# Patient Record
Sex: Female | Born: 1990 | Hispanic: Yes | Marital: Single
Health system: Midwestern US, Community
[De-identification: ages and names within clinical notes are randomized; demographics above are authoritative.]

## PROBLEM LIST (undated history)

## (undated) DIAGNOSIS — G5751 Tarsal tunnel syndrome, right lower limb: Secondary | ICD-10-CM

## (undated) DIAGNOSIS — M79673 Pain in unspecified foot: Secondary | ICD-10-CM

## (undated) DIAGNOSIS — M25571 Pain in right ankle and joints of right foot: Secondary | ICD-10-CM

## (undated) DIAGNOSIS — Z01818 Encounter for other preprocedural examination: Secondary | ICD-10-CM

## (undated) DIAGNOSIS — F909 Attention-deficit hyperactivity disorder, unspecified type: Secondary | ICD-10-CM

## (undated) DIAGNOSIS — N83209 Unspecified ovarian cyst, unspecified side: Secondary | ICD-10-CM

## (undated) DIAGNOSIS — G8929 Other chronic pain: Secondary | ICD-10-CM

## (undated) DIAGNOSIS — D3500 Benign neoplasm of unspecified adrenal gland: Secondary | ICD-10-CM

## (undated) DIAGNOSIS — F329 Major depressive disorder, single episode, unspecified: Secondary | ICD-10-CM

## (undated) DIAGNOSIS — J40 Bronchitis, not specified as acute or chronic: Secondary | ICD-10-CM

## (undated) DIAGNOSIS — R102 Pelvic and perineal pain: Secondary | ICD-10-CM

## (undated) DIAGNOSIS — M549 Dorsalgia, unspecified: Secondary | ICD-10-CM

## (undated) DIAGNOSIS — F419 Anxiety disorder, unspecified: Secondary | ICD-10-CM

## (undated) DIAGNOSIS — F32A Depression, unspecified: Secondary | ICD-10-CM

## (undated) DIAGNOSIS — K529 Noninfective gastroenteritis and colitis, unspecified: Secondary | ICD-10-CM

## (undated) HISTORY — PX: FOOT SURGERY: SHX648

## (undated) HISTORY — PX: OOPHORECTOMY: SHX86

## (undated) HISTORY — DX: Benign neoplasm of unspecified adrenal gland: D35.00

## (undated) HISTORY — PX: ADRENALECTOMY: SHX876

---
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
## 2009-01-14 ENCOUNTER — Emergency Department (HOSPITAL_COMMUNITY): Admission: EM | Admit: 2009-01-14 | Discharge: 2009-01-14 | Payer: Self-pay | Admitting: Emergency Medicine

## 2009-02-19 ENCOUNTER — Encounter: Payer: Self-pay | Admitting: Orthopaedic Surgery

## 2009-02-20 ENCOUNTER — Encounter: Payer: Self-pay | Admitting: Orthopaedic Surgery

## 2009-03-07 ENCOUNTER — Emergency Department (HOSPITAL_COMMUNITY): Admission: EM | Admit: 2009-03-07 | Discharge: 2009-03-07 | Payer: Self-pay | Admitting: Emergency Medicine

## 2009-03-24 ENCOUNTER — Emergency Department (HOSPITAL_COMMUNITY): Admission: EM | Admit: 2009-03-24 | Discharge: 2009-03-25 | Payer: Self-pay | Admitting: Emergency Medicine

## 2009-05-05 ENCOUNTER — Emergency Department (HOSPITAL_COMMUNITY): Admission: EM | Admit: 2009-05-05 | Discharge: 2009-05-06 | Payer: Self-pay | Admitting: Emergency Medicine

## 2009-05-10 ENCOUNTER — Emergency Department (HOSPITAL_COMMUNITY): Admission: EM | Admit: 2009-05-10 | Discharge: 2009-05-10 | Payer: Self-pay | Admitting: Emergency Medicine

## 2009-05-13 ENCOUNTER — Ambulatory Visit (HOSPITAL_COMMUNITY): Admission: RE | Admit: 2009-05-13 | Discharge: 2009-05-13 | Payer: Self-pay | Admitting: Family Medicine

## 2010-02-25 ENCOUNTER — Emergency Department (HOSPITAL_COMMUNITY): Admission: EM | Admit: 2010-02-25 | Discharge: 2010-02-25 | Payer: Self-pay | Admitting: Emergency Medicine

## 2010-03-20 ENCOUNTER — Emergency Department (HOSPITAL_COMMUNITY): Admission: EM | Admit: 2010-03-20 | Discharge: 2010-03-20 | Payer: Self-pay | Admitting: Emergency Medicine

## 2010-05-25 ENCOUNTER — Emergency Department (HOSPITAL_COMMUNITY)
Admission: EM | Admit: 2010-05-25 | Discharge: 2010-05-25 | Payer: Self-pay | Source: Home / Self Care | Admitting: Emergency Medicine

## 2010-07-02 ENCOUNTER — Emergency Department (HOSPITAL_COMMUNITY): Admission: EM | Admit: 2010-07-02 | Discharge: 2010-07-02 | Payer: Self-pay | Admitting: Emergency Medicine

## 2010-10-22 ENCOUNTER — Emergency Department (HOSPITAL_COMMUNITY)
Admission: EM | Admit: 2010-10-22 | Discharge: 2010-10-23 | Disposition: A | Discharge status: Home / Self Care | Payer: Self-pay | Attending: Emergency Medicine | Admitting: Emergency Medicine

## 2010-10-22 ENCOUNTER — Emergency Department (HOSPITAL_COMMUNITY): Payer: Self-pay

## 2010-10-22 DIAGNOSIS — L089 Local infection of the skin and subcutaneous tissue, unspecified: Secondary | ICD-10-CM | POA: Insufficient documentation

## 2010-10-22 DIAGNOSIS — R112 Nausea with vomiting, unspecified: Secondary | ICD-10-CM | POA: Insufficient documentation

## 2010-10-22 DIAGNOSIS — R109 Unspecified abdominal pain: Secondary | ICD-10-CM | POA: Insufficient documentation

## 2010-10-23 LAB — URINE MICROSCOPIC-ADD ON

## 2010-10-23 LAB — URINALYSIS, ROUTINE W REFLEX MICROSCOPIC
Hgb urine dipstick: NEGATIVE
Ketones, ur: >80 mg/dL — AB
Protein, ur: NEGATIVE mg/dL
Specific Gravity, Urine: >1.03 — ABNORMAL HIGH (ref 1.005–1.030)
Urine Glucose, Fasting: NEGATIVE mg/dL
Urobilinogen, UA: 0.2 mg/dL (ref 0.0–1.0)
pH: 5.5 (ref 5.0–8.0)

## 2010-10-23 LAB — PREGNANCY, URINE: Preg Test, Ur: NEGATIVE

## 2010-11-27 LAB — RAPID STREP SCREEN (MED CTR MEBANE ONLY): Streptococcus, Group A Screen (Direct): NEGATIVE

## 2010-12-07 LAB — BASIC METABOLIC PANEL
BUN: 15 mg/dL (ref 6–23)
CO2: 27 mEq/L (ref 19–32)
Calcium: 9 mg/dL (ref 8.4–10.5)
Chloride: 101 mEq/L (ref 96–112)
Creatinine, Ser: 0.83 mg/dL (ref 0.4–1.2)
GFR calc Af Amer: >60 mL/min (ref >60)
GFR calc non Af Amer: >60 mL/min (ref >60)
Glucose, Bld: 106 mg/dL — ABNORMAL HIGH (ref 70–99)
Potassium: 4 mEq/L (ref 3.5–5.1)
Sodium: 137 mEq/L (ref 135–145)

## 2010-12-07 LAB — URINALYSIS, ROUTINE W REFLEX MICROSCOPIC
Bilirubin Urine: NEGATIVE
Glucose, UA: NEGATIVE mg/dL
Hgb urine dipstick: NEGATIVE
Ketones, ur: NEGATIVE mg/dL
Nitrite: NEGATIVE
Protein, ur: NEGATIVE mg/dL
Specific Gravity, Urine: 1.02 (ref 1.005–1.030)
Urobilinogen, UA: 0.2 mg/dL (ref 0.0–1.0)
pH: 5.5 (ref 5.0–8.0)

## 2010-12-07 LAB — PREGNANCY, URINE: Preg Test, Ur: NEGATIVE

## 2011-05-25 LAB — DIFFERENTIAL
Basophils Absolute: 0.2 — ABNORMAL HIGH
Basophils Relative: 1
Eosinophils Absolute: 0.2
Eosinophils Relative: 1
Lymphocytes Relative: 8 — ABNORMAL LOW
Lymphs Abs: 1.7
Monocytes Absolute: 0.9
Monocytes Relative: 4
Neutro Abs: 18.4 — ABNORMAL HIGH
Neutrophils Relative %: 86 — ABNORMAL HIGH

## 2011-05-25 LAB — WET PREP, GENITAL
Clue Cells Wet Prep HPF POC: NONE SEEN
Trich, Wet Prep: NONE SEEN
Yeast Wet Prep HPF POC: NONE SEEN

## 2011-05-25 LAB — CBC
HCT: 42.2
Hemoglobin: 14.2
MCHC: 33.6
MCV: 94.3
Platelets: 266
RBC: 4.47
RDW: 14
WBC: 21.3 — ABNORMAL HIGH

## 2011-05-25 LAB — COMPREHENSIVE METABOLIC PANEL
Alkaline Phosphatase: 50
BUN: 13
CO2: 27
Chloride: 111
Creatinine, Ser: 0.69
GFR calc non Af Amer: >60
Glucose, Bld: 126 — ABNORMAL HIGH
Total Bilirubin: 0.5

## 2011-05-25 LAB — URINALYSIS, ROUTINE W REFLEX MICROSCOPIC
Bilirubin Urine: NEGATIVE
Glucose, UA: NEGATIVE
Ketones, ur: NEGATIVE
Leukocytes, UA: NEGATIVE
Nitrite: NEGATIVE
Protein, ur: NEGATIVE
Specific Gravity, Urine: 1.025
Urobilinogen, UA: 0.2
pH: 6

## 2011-05-25 LAB — COMPREHENSIVE METABOLIC PANEL WITH GFR
ALT: 20
AST: 15
Albumin: 3 — ABNORMAL LOW
Calcium: 8.8
GFR calc Af Amer: >60
Potassium: 4.1
Sodium: 138
Total Protein: 5.6 — ABNORMAL LOW

## 2011-05-25 LAB — PREGNANCY, URINE
Preg Test, Ur: NEGATIVE
Preg Test, Ur: NEGATIVE

## 2011-05-25 LAB — LIPASE, BLOOD: Lipase: 20

## 2011-05-25 LAB — GC/CHLAMYDIA PROBE AMP, GENITAL
Chlamydia, DNA Probe: NEGATIVE
GC Probe Amp, Genital: NEGATIVE

## 2011-05-25 LAB — URINE MICROSCOPIC-ADD ON

## 2011-05-25 LAB — D-DIMER, QUANTITATIVE: D-Dimer, Quant: 0.52 — ABNORMAL HIGH

## 2011-05-31 LAB — HEPATIC FUNCTION PANEL
AST: 18
Albumin: 3.6
Alkaline Phosphatase: 60
Bilirubin, Direct: 0.1
Total Bilirubin: 0.8

## 2011-05-31 LAB — BASIC METABOLIC PANEL
CO2: 25
Calcium: 9.3
Chloride: 104
Glucose, Bld: 106 — ABNORMAL HIGH
Sodium: 138

## 2011-05-31 LAB — DIFFERENTIAL
Basophils Absolute: 0.1
Lymphocytes Relative: 7 — ABNORMAL LOW
Lymphs Abs: 1.2
Neutrophils Relative %: 88 — ABNORMAL HIGH

## 2011-05-31 LAB — PREGNANCY, URINE: Preg Test, Ur: NEGATIVE

## 2011-05-31 LAB — URINALYSIS, ROUTINE W REFLEX MICROSCOPIC
Ketones, ur: NEGATIVE
Leukocytes, UA: NEGATIVE
Nitrite: NEGATIVE
Specific Gravity, Urine: >1.03 — ABNORMAL HIGH
Urobilinogen, UA: 0.2
pH: 5

## 2011-05-31 LAB — CBC
Platelets: 281
RDW: 13.2
WBC: 19.1 — ABNORMAL HIGH

## 2011-05-31 LAB — URINE MICROSCOPIC-ADD ON

## 2011-06-07 LAB — CBC
HCT: 40.6
Platelets: 225
RDW: 13.2
WBC: 8

## 2011-06-07 LAB — RAPID URINE DRUG SCREEN, HOSP PERFORMED
Amphetamines: NOT DETECTED
Barbiturates: NOT DETECTED
Benzodiazepines: NOT DETECTED
Tetrahydrocannabinol: NOT DETECTED

## 2011-06-07 LAB — DIFFERENTIAL
Basophils Absolute: 0
Eosinophils Absolute: 0.2
Eosinophils Relative: 3
Lymphocytes Relative: 23 — ABNORMAL LOW
Neutrophils Relative %: 64

## 2011-06-07 LAB — URINALYSIS, ROUTINE W REFLEX MICROSCOPIC
Glucose, UA: NEGATIVE
Ketones, ur: NEGATIVE
Nitrite: NEGATIVE
Protein, ur: NEGATIVE
Urobilinogen, UA: 1

## 2011-06-07 LAB — BASIC METABOLIC PANEL
BUN: 6
Creatinine, Ser: 0.59
Glucose, Bld: 96
Potassium: 4.3

## 2011-06-07 LAB — PREGNANCY, URINE: Preg Test, Ur: NEGATIVE

## 2011-06-07 LAB — ETHANOL: Alcohol, Ethyl (B): <5

## 2013-10-15 ENCOUNTER — Encounter (HOSPITAL_COMMUNITY): Payer: Self-pay | Admitting: Emergency Medicine

## 2013-10-15 ENCOUNTER — Emergency Department (HOSPITAL_COMMUNITY)
Admission: EM | Admit: 2013-10-15 | Discharge: 2013-10-16 | Disposition: A | Discharge status: Home / Self Care | Payer: Medicaid - Out of State | Attending: Emergency Medicine | Admitting: Emergency Medicine

## 2013-10-15 DIAGNOSIS — N898 Other specified noninflammatory disorders of vagina: Secondary | ICD-10-CM | POA: Insufficient documentation

## 2013-10-15 DIAGNOSIS — Z3202 Encounter for pregnancy test, result negative: Secondary | ICD-10-CM | POA: Insufficient documentation

## 2013-10-15 DIAGNOSIS — F17200 Nicotine dependence, unspecified, uncomplicated: Secondary | ICD-10-CM | POA: Insufficient documentation

## 2013-10-15 DIAGNOSIS — N839 Noninflammatory disorder of ovary, fallopian tube and broad ligament, unspecified: Secondary | ICD-10-CM | POA: Insufficient documentation

## 2013-10-15 DIAGNOSIS — Z9079 Acquired absence of other genital organ(s): Secondary | ICD-10-CM | POA: Insufficient documentation

## 2013-10-15 DIAGNOSIS — N83209 Unspecified ovarian cyst, unspecified side: Secondary | ICD-10-CM | POA: Insufficient documentation

## 2013-10-15 DIAGNOSIS — N838 Other noninflammatory disorders of ovary, fallopian tube and broad ligament: Secondary | ICD-10-CM

## 2013-10-15 DIAGNOSIS — R109 Unspecified abdominal pain: Secondary | ICD-10-CM

## 2013-10-15 HISTORY — DX: Unspecified ovarian cyst, unspecified side: N83.209

## 2013-10-15 NOTE — ED Notes (Signed)
Patient complaining of left lower abdominal pain/pelvic pain. Reports "I have problems with my left ovary because I have cysts."

## 2013-10-16 ENCOUNTER — Emergency Department (HOSPITAL_COMMUNITY): Payer: Medicaid - Out of State

## 2013-10-16 LAB — URINALYSIS, ROUTINE W REFLEX MICROSCOPIC
Bilirubin Urine: NEGATIVE
GLUCOSE, UA: NEGATIVE mg/dL
Hgb urine dipstick: NEGATIVE
KETONES UR: NEGATIVE mg/dL
LEUKOCYTES UA: NEGATIVE
Nitrite: NEGATIVE
PROTEIN: NEGATIVE mg/dL
Specific Gravity, Urine: 1.02 (ref 1.005–1.030)
Urobilinogen, UA: 0.2 mg/dL (ref 0.0–1.0)
pH: 6 (ref 5.0–8.0)

## 2013-10-16 LAB — COMPREHENSIVE METABOLIC PANEL
ALT: 12 U/L (ref 0–35)
AST: 16 U/L (ref 0–37)
Albumin: 3.9 g/dL (ref 3.5–5.2)
Alkaline Phosphatase: 47 U/L (ref 39–117)
BUN: 8 mg/dL (ref 6–23)
CALCIUM: 9.4 mg/dL (ref 8.4–10.5)
CO2: 25 meq/L (ref 19–32)
CREATININE: 0.62 mg/dL (ref 0.50–1.10)
Chloride: 104 mEq/L (ref 96–112)
Glucose, Bld: 91 mg/dL (ref 70–99)
Potassium: 4.4 mEq/L (ref 3.7–5.3)
Sodium: 139 mEq/L (ref 137–147)
Total Bilirubin: 0.3 mg/dL (ref 0.3–1.2)
Total Protein: 7.8 g/dL (ref 6.0–8.3)

## 2013-10-16 LAB — CBC WITH DIFFERENTIAL/PLATELET
BASOS PCT: 0 % (ref 0–1)
Basophils Absolute: 0 10*3/uL (ref 0.0–0.1)
Eosinophils Absolute: 0.5 10*3/uL (ref 0.0–0.7)
Eosinophils Relative: 6 % — ABNORMAL HIGH (ref 0–5)
HCT: 41.7 % (ref 36.0–46.0)
Hemoglobin: 14.3 g/dL (ref 12.0–15.0)
Lymphocytes Relative: 34 % (ref 12–46)
Lymphs Abs: 3 10*3/uL (ref 0.7–4.0)
MCH: 32.2 pg (ref 26.0–34.0)
MCHC: 34.3 g/dL (ref 30.0–36.0)
MCV: 93.9 fL (ref 78.0–100.0)
Monocytes Absolute: 0.8 10*3/uL (ref 0.1–1.0)
Monocytes Relative: 9 % (ref 3–12)
NEUTROS PCT: 51 % (ref 43–77)
Neutro Abs: 4.6 10*3/uL (ref 1.7–7.7)
PLATELETS: 266 10*3/uL (ref 150–400)
RBC: 4.44 MIL/uL (ref 3.87–5.11)
RDW: 12.7 % (ref 11.5–15.5)
WBC: 8.9 10*3/uL (ref 4.0–10.5)

## 2013-10-16 LAB — LIPASE, BLOOD: LIPASE: 30 U/L (ref 11–59)

## 2013-10-16 LAB — PREGNANCY, URINE: Preg Test, Ur: NEGATIVE

## 2013-10-16 LAB — CA 125: CA 125: 39.1 U/mL — AB (ref 0.0–30.2)

## 2013-10-16 MED ORDER — ONDANSETRON HCL 4 MG/2ML IJ SOLN
INTRAMUSCULAR | Status: AC
  Filled 2013-10-16: qty 2

## 2013-10-16 MED ORDER — ONDANSETRON HCL 4 MG/2ML IJ SOLN
4.0000 mg | Freq: Once | INTRAMUSCULAR | Status: AC
  Administered 2013-10-16: 4 mg via INTRAVENOUS

## 2013-10-16 MED ORDER — METOCLOPRAMIDE HCL 5 MG/ML IJ SOLN
10.0000 mg | Freq: Once | INTRAMUSCULAR | Status: AC
  Administered 2013-10-16: 10 mg via INTRAVENOUS
  Filled 2013-10-16: qty 2

## 2013-10-16 MED ORDER — HYDROCODONE-ACETAMINOPHEN 5-325 MG PO TABS: 2.0000 | ORAL_TABLET | ORAL | Status: DC | PRN

## 2013-10-16 MED ORDER — KETOROLAC TROMETHAMINE 30 MG/ML IJ SOLN
INTRAMUSCULAR | Status: AC
  Filled 2013-10-16: qty 1

## 2013-10-16 MED ORDER — HYDROMORPHONE HCL PF 1 MG/ML IJ SOLN
0.5000 mg | Freq: Once | INTRAMUSCULAR | Status: AC | PRN
  Administered 2013-10-16: 0.5 mg via INTRAVENOUS
  Filled 2013-10-16: qty 1

## 2013-10-16 MED ORDER — SODIUM CHLORIDE 0.9 % IV SOLN
1000.0000 mL | Freq: Once | INTRAVENOUS | Status: AC
  Administered 2013-10-16: 1000 mL via INTRAVENOUS

## 2013-10-16 MED ORDER — ONDANSETRON HCL 4 MG/2ML IJ SOLN
4.0000 mg | Freq: Once | INTRAMUSCULAR | Status: DC
  Filled 2013-10-16: qty 2

## 2013-10-16 MED ORDER — IOHEXOL 300 MG/ML  SOLN
100.0000 mL | Freq: Once | INTRAMUSCULAR | Status: AC | PRN
  Administered 2013-10-16: 100 mL via INTRAVENOUS

## 2013-10-16 MED ORDER — IOHEXOL 300 MG/ML  SOLN
50.0000 mL | Freq: Once | INTRAMUSCULAR | Status: AC | PRN
  Administered 2013-10-16: 50 mL via ORAL

## 2013-10-16 MED ORDER — ONDANSETRON 4 MG PO TBDP: ORAL_TABLET | ORAL | Status: DC

## 2013-10-16 MED ORDER — KETOROLAC TROMETHAMINE 30 MG/ML IJ SOLN
30.0000 mg | Freq: Once | INTRAMUSCULAR | Status: AC
  Administered 2013-10-16: 30 mg via INTRAVENOUS

## 2013-10-16 MED ORDER — FENTANYL CITRATE 0.05 MG/ML IJ SOLN
50.0000 ug | Freq: Once | INTRAMUSCULAR | Status: AC
  Administered 2013-10-16: 50 ug via INTRAVENOUS
  Filled 2013-10-16: qty 2

## 2013-10-16 NOTE — ED Notes (Signed)
Patient complaining of nausea and returning pain. Advised MD.

## 2013-10-16 NOTE — Discharge Instructions (Signed)
See Gynecologist in the next week, call for appointment. Take ibuprofen for pain.  For severe pain take norco or vicodin however realize they have the potential for addiction and it can make you sleepy and has tylenol in it.  No operating machinery while taking. If you were given medicines take as directed.  If you are on coumadin or contraceptives realize their levels and effectiveness is altered by many different medicines.  If you have any reaction (rash, tongues swelling, other) to the medicines stop taking and see a physician.   Please follow up as directed and return to the ER or see a physician for new or worsening symptoms.  Thank you.

## 2013-10-16 NOTE — ED Provider Notes (Signed)
CSN: [id number]     Arrival date & time 10/15/13  2148 History   First MD Initiated Contact with Patient 10/15/13 2356     Chief Complaint  Patient presents with  . Abdominal Pain     (Consider location/radiation/quality/duration/timing/severity/associated sxs/prior Treatment) HPI Comments: 23 yo female with ovarian cyst, right oophorectomy, ?pelvic tumor presents with vaginal bleeding and lower abdo pain worse left lower for a few days.  Pt has had intermittent similar sxs for months.  She has seen OB in [place],  Intermittent abdominal pain described as "stabbing" and as "pressure" over the past 6 months.She states that she has been having vaginal bleeding for 6 months which "just stopped". She states that she has been seen by her OB-GYN in [place] and has been diagnosed with a ovarian cyst on the left and possibly a cancerous tumor. She states that she does not have a OB-GYN in [place] due to recently moving here. Pt is not sexually active for months.  Vaginal bleeding mild, intermittent for months.      Patient is a 22 y.o. female presenting with abdominal pain. The history is provided by the patient.  Abdominal Pain Associated symptoms: nausea and vaginal bleeding   Associated symptoms: no chest pain, no chills, no dysuria, no fever, no shortness of breath, no vaginal discharge and no vomiting     Past Medical History  Diagnosis Date  . Ovarian cyst    Past Surgical History  Procedure Laterality Date  . Oophorectomy Right    History reviewed. No pertinent family history. History  Substance Use Topics  . Smoking status: Current Every Day Smoker  . Smokeless tobacco: Not on file  . Alcohol Use: No   OB History   Grav Para Term Preterm Abortions TAB SAB Ect Mult Living                 Review of Systems  Constitutional: Negative for fever and chills.  HENT: Negative for congestion.   Eyes: Negative for visual disturbance.  Respiratory: Negative for shortness of  breath.   Cardiovascular: Negative for chest pain.  Gastrointestinal: Positive for nausea and abdominal pain. Negative for vomiting.  Genitourinary: Positive for vaginal bleeding. Negative for dysuria, flank pain and vaginal discharge.  Musculoskeletal: Negative for back pain, neck pain and neck stiffness.  Skin: Negative for rash.  Neurological: Negative for light-headedness and headaches.      Allergies  Tylenol with codeine #3  Home Medications  No current outpatient prescriptions on file. BP 134/77  Pulse 80  Temp(Src) 98.3 F (36.8 C) (Oral)  Resp 24  Ht 5\' 6"  (1.676 m)  Wt 180 lb (81.647 kg)  BMI 29.07 kg/m2  SpO2 100%  LMP 10/01/2013 Physical Exam  Nursing note and vitals reviewed. Constitutional: She is oriented to person, place, and time. She appears well-developed and well-nourished.  HENT:  Head: Normocephalic and atraumatic.  Eyes: Conjunctivae are normal. Right eye exhibits no discharge. Left eye exhibits no discharge.  Neck: Normal range of motion. Neck supple. No tracheal deviation present.  Cardiovascular: Normal rate and regular rhythm.   Pulmonary/Chest: Effort normal and breath sounds normal.  Abdominal: Soft. She exhibits no distension. There is tenderness (lower bilateral and suprapubic, worse left lower). There is no guarding.  Tender to both bilateral lower abdomen/pelvis region and suprapubic area. Mass-like firmness to central abdomen, which is mildly tender. Abdomen soft overall. No guarding.   Genitourinary:  Small amount of blood at cervical os, no cmt, mild  left adnexal tenderness  Musculoskeletal: She exhibits no edema.  Neurological: She is alert and oriented to person, place, and time.  Skin: Skin is warm. No rash noted.  Psychiatric: She has a normal mood and affect.    ED Course  Procedures (including critical care time) Labs Review Labs Reviewed  CBC WITH DIFFERENTIAL - Abnormal; Notable for the following:    Eosinophils Relative 6  (*)    All other components within normal limits  PREGNANCY, URINE  URINALYSIS, ROUTINE W REFLEX MICROSCOPIC  COMPREHENSIVE METABOLIC PANEL  LIPASE, BLOOD   Imaging Review US Ob Transvaginal  10/16/2013   CLINICAL DATA:  Left pelvic pain. Question of ruptured ovarian cyst on CT.  EXAM: TRANSABDOMINAL AND TRANSVAGINAL ULTRASOUND OF PELVIS  DOPPLER ULTRASOUND OF OVARIES  TECHNIQUE: Both transabdominal and transvaginal ultrasound examinations of the pelvis were performed. Transabdominal technique was performed for global imaging of the pelvis including uterus, ovaries, adnexal regions, and pelvic cul-de-sac.  It was necessary to proceed with endovaginal exam following the transabdominal exam to visualize the left ovary in greater detail. Color and duplex Doppler ultrasound was utilized to evaluate blood flow to the ovaries.  COMPARISON:  CT of the abdomen and pelvis from earlier today at 3:13 a.m.  FINDINGS: Uterus  Measurements: 7.2 x 4.1 x 4.6 cm. No fibroids or other mass visualized.  Endometrium  Thickness: 0.8 cm. A small amount of complex fluid is seen within the endometrial canal.  Right ovary  Status post right-sided oophorectomy.  Left ovary  Measurements: 3.4 x 2.7 x 2.1 cm. There appears to be a complex cystic region with low-level echoes measuring 5.2 x 3.1 x 5.1 cm, arising adjacent to the ovary. Doppler evaluation demonstrates arterial waveforms with respect to this region; this is concerning for a mass.  Pulsed Doppler evaluation of both ovaries demonstrates normal low-resistance arterial and venous waveforms.  Other findings  A moderate amount of diffusely septated fluid is seen predominantly about the left ovary, with trace complex fluid on the right side. The appearance is atypical for hydrosalpinx; this may simply reflect loculation of pelvic fluid.  IMPRESSION: 1. Complex cystic region with low-level echoes, measuring 5.2 x 3.1 x 5.1 cm, arising adjacent to the left ovary. Doppler  evaluation demonstrates arterial waveforms with respect to this region, raising concern for a mass. Consider surgical evaluation. 2. Moderate amount of diffusely septated fluid noted about the left ovary, with trace complex fluid on the right. The appearance is atypical for hydrosalpinx; this may simply reflect loculation of pelvic fluid. 3. Small amount of complex fluid noted within the endometrial canal.   Electronically Signed   By: Garald Balding M.D.   On: 10/16/2013 07:17   US Pelvis Complete  10/16/2013   CLINICAL DATA:  Left pelvic pain. Question of ruptured ovarian cyst on CT.  EXAM: TRANSABDOMINAL AND TRANSVAGINAL ULTRASOUND OF PELVIS  DOPPLER ULTRASOUND OF OVARIES  TECHNIQUE: Both transabdominal and transvaginal ultrasound examinations of the pelvis were performed. Transabdominal technique was performed for global imaging of the pelvis including uterus, ovaries, adnexal regions, and pelvic cul-de-sac.  It was necessary to proceed with endovaginal exam following the transabdominal exam to visualize the left ovary in greater detail. Color and duplex Doppler ultrasound was utilized to evaluate blood flow to the ovaries.  COMPARISON:  CT of the abdomen and pelvis from earlier today at 3:13 a.m.  FINDINGS: Uterus  Measurements: 7.2 x 4.1 x 4.6 cm. No fibroids or other mass visualized.  Endometrium  Thickness: 0.8  cm. A small amount of complex fluid is seen within the endometrial canal.  Right ovary  Status post right-sided oophorectomy.  Left ovary  Measurements: 3.4 x 2.7 x 2.1 cm. There appears to be a complex cystic region with low-level echoes measuring 5.2 x 3.1 x 5.1 cm, arising adjacent to the ovary. Doppler evaluation demonstrates arterial waveforms with respect to this region; this is concerning for a mass.  Pulsed Doppler evaluation of both ovaries demonstrates normal low-resistance arterial and venous waveforms.  Other findings  A moderate amount of diffusely septated fluid is seen predominantly  about the left ovary, with trace complex fluid on the right side. The appearance is atypical for hydrosalpinx; this may simply reflect loculation of pelvic fluid.  IMPRESSION: 1. Complex cystic region with low-level echoes, measuring 5.2 x 3.1 x 5.1 cm, arising adjacent to the left ovary. Doppler evaluation demonstrates arterial waveforms with respect to this region, raising concern for a mass. Consider surgical evaluation. 2. Moderate amount of diffusely septated fluid noted about the left ovary, with trace complex fluid on the right. The appearance is atypical for hydrosalpinx; this may simply reflect loculation of pelvic fluid. 3. Small amount of complex fluid noted within the endometrial canal.   Electronically Signed   By: Garald Balding M.D.   On: 10/16/2013 07:17   Ct Abdomen Pelvis W Contrast  10/16/2013   CLINICAL DATA:  Left lower quadrant abdominal pain for 6 months. Central abdominal firmness and bilateral tenderness.  EXAM: CT ABDOMEN AND PELVIS WITH CONTRAST  TECHNIQUE: Multidetector CT imaging of the abdomen and pelvis was performed using the standard protocol following bolus administration of intravenous contrast.  CONTRAST:  100 mL of Omnipaque 300 IV contrast  COMPARISON:  CT ABDOMEN WO/W CM dated 07/12/2008; MR ABDOMEN WO/W CM dated 07/12/2008; US ABDOMEN COMPLETE dated 05/13/2009  FINDINGS: The visualized lung bases are clear.  The liver and spleen are unremarkable in appearance. The gallbladder is within normal limits. The pancreas and left adrenal gland are unremarkable.  The kidneys are unremarkable in appearance. There is no evidence of hydronephrosis. No renal or ureteral stones are seen. No perinephric stranding is appreciated. A clip is noted adjacent to the upper pole of the right kidney.  No free fluid is identified. The small bowel is unremarkable in appearance. The stomach is within normal limits. No acute vascular abnormalities are seen.  The appendix is normal in caliber and  contains contrast, without evidence for appendicitis. Contrast progresses to the level of the rectum. The colon is unremarkable in appearance.  The bladder is mildly distended and grossly unremarkable in appearance. The uterus is somewhat heterogeneous and difficult to fully characterize. A multilobulated cystic focus is noted at the left adnexa, and there is a small amount of fluid of borderline simple attenuation tracking about the left adnexa and pelvis. This may reflect a ruptured left ovarian cyst. Pyosalpinx is considered less likely given the lack of leukocytosis. No inguinal lymphadenopathy is seen.  No acute osseous abnormalities are identified.  IMPRESSION: 1. Multilobulated cystic focus at the left adnexa, with a small amount of fluid of borderline simple attenuation tracking about the left adnexa and pelvis. This raises concern for a recently ruptured left ovarian cyst. Pyosalpinx is considered less likely given the lack of leukocytosis. 2. The uterus appears somewhat heterogeneous; this is nonspecific.   Electronically Signed   By: Garald Balding M.D.   On: 10/16/2013 04:46   [place] Art/ven Flow Abd Pelv Doppler  10/16/2013  CLINICAL DATA:  Left pelvic pain. Question of ruptured ovarian cyst on CT.  EXAM: TRANSABDOMINAL AND TRANSVAGINAL ULTRASOUND OF PELVIS  DOPPLER ULTRASOUND OF OVARIES  TECHNIQUE: Both transabdominal and transvaginal ultrasound examinations of the pelvis were performed. Transabdominal technique was performed for global imaging of the pelvis including uterus, ovaries, adnexal regions, and pelvic cul-de-sac.  It was necessary to proceed with endovaginal exam following the transabdominal exam to visualize the left ovary in greater detail. Color and duplex Doppler ultrasound was utilized to evaluate blood flow to the ovaries.  COMPARISON:  CT of the abdomen and pelvis from earlier today at 3:13 a.m.  FINDINGS: Uterus  Measurements: 7.2 x 4.1 x 4.6 cm. No fibroids or other mass visualized.   Endometrium  Thickness: 0.8 cm. A small amount of complex fluid is seen within the endometrial canal.  Right ovary  Status post right-sided oophorectomy.  Left ovary  Measurements: 3.4 x 2.7 x 2.1 cm. There appears to be a complex cystic region with low-level echoes measuring 5.2 x 3.1 x 5.1 cm, arising adjacent to the ovary. Doppler evaluation demonstrates arterial waveforms with respect to this region; this is concerning for a mass.  Pulsed Doppler evaluation of both ovaries demonstrates normal low-resistance arterial and venous waveforms.  Other findings  A moderate amount of diffusely septated fluid is seen predominantly about the left ovary, with trace complex fluid on the right side. The appearance is atypical for hydrosalpinx; this may simply reflect loculation of pelvic fluid.  IMPRESSION: 1. Complex cystic region with low-level echoes, measuring 5.2 x 3.1 x 5.1 cm, arising adjacent to the left ovary. Doppler evaluation demonstrates arterial waveforms with respect to this region, raising concern for a mass. Consider surgical evaluation. 2. Moderate amount of diffusely septated fluid noted about the left ovary, with trace complex fluid on the right. The appearance is atypical for hydrosalpinx; this may simply reflect loculation of pelvic fluid. 3. Small amount of complex fluid noted within the endometrial canal.   Electronically Signed   By: Garald Balding M.D.   On: 10/16/2013 07:17    EKG Interpretation   None       MDM   Final diagnoses:  Ovarian mass  Abdominal pain  Ovarian cyst  Smoker  Concern for ovarian tumor vs cyst vs uterine fibroids vs less likely diverticular/ other. No recent imaging of abdo.  CT abdo/ pelvis. Pelvic exam done, no acute findings, left adnexal tenderness. CT pending, pain meds given.   Rechecked, repeat pain medicines, pt improved then worsened. CT showed likely ruptured cyst however with hx of torsion and worsening pain [place] tech called in to look For  signs of torsion on left ovary.  [place] concern for ovarian mass, blood flow okay.  Spoke with Dr Glo Herring, he will follow closely outpt, updated pt.  Pain and nausea meds for home. Fluids given.      Mariea Clonts, MD 10/16/13 [phone]

## 2013-10-17 ENCOUNTER — Encounter (HOSPITAL_COMMUNITY): Payer: Self-pay | Admitting: Emergency Medicine

## 2013-10-17 ENCOUNTER — Emergency Department (HOSPITAL_COMMUNITY)
Admission: EM | Admit: 2013-10-17 | Discharge: 2013-10-17 | Disposition: A | Discharge status: Home / Self Care | Payer: Medicaid - Out of State | Attending: Emergency Medicine | Admitting: Emergency Medicine

## 2013-10-17 DIAGNOSIS — R1909 Other intra-abdominal and pelvic swelling, mass and lump: Secondary | ICD-10-CM | POA: Insufficient documentation

## 2013-10-17 DIAGNOSIS — F17200 Nicotine dependence, unspecified, uncomplicated: Secondary | ICD-10-CM | POA: Insufficient documentation

## 2013-10-17 DIAGNOSIS — Z9079 Acquired absence of other genital organ(s): Secondary | ICD-10-CM | POA: Insufficient documentation

## 2013-10-17 DIAGNOSIS — N949 Unspecified condition associated with female genital organs and menstrual cycle: Secondary | ICD-10-CM | POA: Insufficient documentation

## 2013-10-17 DIAGNOSIS — R102 Pelvic and perineal pain: Secondary | ICD-10-CM

## 2013-10-17 DIAGNOSIS — R1900 Intra-abdominal and pelvic swelling, mass and lump, unspecified site: Secondary | ICD-10-CM

## 2013-10-17 DIAGNOSIS — Z3202 Encounter for pregnancy test, result negative: Secondary | ICD-10-CM | POA: Insufficient documentation

## 2013-10-17 DIAGNOSIS — Z79899 Other long term (current) drug therapy: Secondary | ICD-10-CM | POA: Insufficient documentation

## 2013-10-17 LAB — URINALYSIS, ROUTINE W REFLEX MICROSCOPIC
Bilirubin Urine: NEGATIVE
Glucose, UA: NEGATIVE mg/dL
Hgb urine dipstick: NEGATIVE
Ketones, ur: NEGATIVE mg/dL
LEUKOCYTES UA: NEGATIVE
NITRITE: NEGATIVE
PH: 7 (ref 5.0–8.0)
Protein, ur: NEGATIVE mg/dL
Specific Gravity, Urine: 1.015 (ref 1.005–1.030)
UROBILINOGEN UA: 0.2 mg/dL (ref 0.0–1.0)

## 2013-10-17 LAB — CBC WITH DIFFERENTIAL/PLATELET
BASOS PCT: 0 % (ref 0–1)
Basophils Absolute: 0 10*3/uL (ref 0.0–0.1)
EOS PCT: 4 % (ref 0–5)
Eosinophils Absolute: 0.3 10*3/uL (ref 0.0–0.7)
HCT: 39.8 % (ref 36.0–46.0)
Hemoglobin: 13.6 g/dL (ref 12.0–15.0)
Lymphocytes Relative: 26 % (ref 12–46)
Lymphs Abs: 2.1 10*3/uL (ref 0.7–4.0)
MCH: 31.9 pg (ref 26.0–34.0)
MCHC: 34.2 g/dL (ref 30.0–36.0)
MCV: 93.4 fL (ref 78.0–100.0)
Monocytes Absolute: 0.6 10*3/uL (ref 0.1–1.0)
Monocytes Relative: 8 % (ref 3–12)
NEUTROS PCT: 62 % (ref 43–77)
Neutro Abs: 5 10*3/uL (ref 1.7–7.7)
Platelets: 251 10*3/uL (ref 150–400)
RBC: 4.26 MIL/uL (ref 3.87–5.11)
RDW: 12.8 % (ref 11.5–15.5)
WBC: 8.1 10*3/uL (ref 4.0–10.5)

## 2013-10-17 LAB — PREGNANCY, URINE: Preg Test, Ur: NEGATIVE

## 2013-10-17 MED ORDER — KETOROLAC TROMETHAMINE 30 MG/ML IJ SOLN
30.0000 mg | Freq: Once | INTRAMUSCULAR | Status: AC
  Administered 2013-10-17: 30 mg via INTRAVENOUS
  Filled 2013-10-17: qty 1

## 2013-10-17 MED ORDER — MORPHINE SULFATE 4 MG/ML IJ SOLN
4.0000 mg | INTRAMUSCULAR | Status: DC | PRN
  Administered 2013-10-17: 4 mg via INTRAVENOUS
  Filled 2013-10-17: qty 1

## 2013-10-17 MED ORDER — ONDANSETRON HCL 4 MG/2ML IJ SOLN
4.0000 mg | Freq: Once | INTRAMUSCULAR | Status: AC
  Administered 2013-10-17: 4 mg via INTRAVENOUS
  Filled 2013-10-17: qty 2

## 2013-10-17 NOTE — ED Notes (Signed)
Abdominal pain for a while.  Vaginal bleeding times 6 months.  Been seen here in er with same.  Was better and abdominal pain came back last night. No vomiting, fever or diarrhea.

## 2013-10-17 NOTE — ED Provider Notes (Signed)
CSN: [id number]     Arrival date & time 10/17/13  1309 History   First MD Initiated Contact with Patient 10/17/13 1456     Chief Complaint  Patient presents with  . Abdominal Pain      HPI  Patient presents with complaint of left lower abdominal pain.  Seen and evaluated here 2 days ago with an extensive workup for the same complaint. Today, as well as 2 days ago, she reports a 6 month history of abdominal pain. She states it is been followed extensively in [place]. She was told she might have "tumor". CT and ultrasound show prior right oophorectomy, and left adnexal regular mass. Range of motion make further followup with Dr. Glo Herring the local OB/GYN. She states she has not make contact with the office yet. Pain came back last night, it is worse this morning. She presents here. This was not a sudden onset of pain. She's not had any vaginal bleeding or vaginal discharge since her visit 2 days ago. Has not had fever chills nausea vomiting or diarrhea.  Past Medical History  Diagnosis Date  . Ovarian cyst    Past Surgical History  Procedure Laterality Date  . Oophorectomy Right    History reviewed. No pertinent family history. History  Substance Use Topics  . Smoking status: Current Every Day Smoker  . Smokeless tobacco: Not on file  . Alcohol Use: No   OB History   Grav Para Term Preterm Abortions TAB SAB Ect Mult Living                 Review of Systems  Constitutional: Negative for fever, chills, diaphoresis, appetite change and fatigue.  HENT: Negative for mouth sores, sore throat and trouble swallowing.   Eyes: Negative for visual disturbance.  Respiratory: Negative for cough, chest tightness, shortness of breath and wheezing.   Cardiovascular: Negative for chest pain.  Gastrointestinal: Positive for abdominal pain. Negative for nausea, vomiting, diarrhea and abdominal distention.  Endocrine: Negative for polydipsia, polyphagia and polyuria.  Genitourinary: Positive for  pelvic pain. Negative for dysuria, frequency and hematuria.  Musculoskeletal: Negative for gait problem.  Skin: Negative for color change, pallor and rash.  Neurological: Negative for dizziness, syncope, light-headedness and headaches.  Hematological: Does not bruise/bleed easily.  Psychiatric/Behavioral: Negative for behavioral problems and confusion.      Allergies  Tylenol with codeine #3  Home Medications   Current Outpatient Rx  Name  Route  Sig  Dispense  Refill  . albuterol (PROVENTIL HFA;VENTOLIN HFA) 108 (90 BASE) MCG/ACT inhaler   Inhalation   Inhale 2 puffs into the lungs every 6 (six) hours as needed for wheezing or shortness of breath.         Marland Kitchen HYDROcodone-acetaminophen (NORCO) 5-325 MG per tablet   Oral   Take 2 tablets by mouth every 4 (four) hours as needed.   10 tablet   0   . ondansetron (ZOFRAN-ODT) 4 MG disintegrating tablet   Oral   Take 4 mg by mouth every 4 (four) hours as needed for nausea.          BP 124/74  Pulse 79  Temp(Src) 98.1 F (36.7 C) (Oral)  Resp 18  Ht 5\' 6"  (1.676 m)  Wt 180 lb (81.647 kg)  BMI 29.07 kg/m2  SpO2 98%  LMP 10/01/2013 Physical Exam  Constitutional: She is oriented to person, place, and time. She appears well-developed and well-nourished. No distress.  HENT:  Head: Normocephalic.  Eyes: Conjunctivae are normal.  Pupils are equal, round, and reactive to light. No scleral icterus.  Neck: Normal range of motion. Neck supple. No thyromegaly present.  Cardiovascular: Normal rate and regular rhythm.  Exam reveals no gallop and no friction rub.   No murmur heard. Pulmonary/Chest: Effort normal and breath sounds normal. No respiratory distress. She has no wheezes. She has no rales.  Abdominal: Soft. Bowel sounds are normal. She exhibits no distension. There is tenderness. There is no rebound.    Musculoskeletal: Normal range of motion.  Neurological: She is alert and oriented to person, place, and time.  Skin:  Skin is warm and dry. No rash noted.  Psychiatric: She has a normal mood and affect. Her behavior is normal.    ED Course  Procedures (including critical care time) Labs Review Labs Reviewed  URINALYSIS, ROUTINE W REFLEX MICROSCOPIC  PREGNANCY, URINE  CBC WITH DIFFERENTIAL   Imaging Review US Ob Transvaginal  10/16/2013   CLINICAL DATA:  Left pelvic pain. Question of ruptured ovarian cyst on CT.  EXAM: TRANSABDOMINAL AND TRANSVAGINAL ULTRASOUND OF PELVIS  DOPPLER ULTRASOUND OF OVARIES  TECHNIQUE: Both transabdominal and transvaginal ultrasound examinations of the pelvis were performed. Transabdominal technique was performed for global imaging of the pelvis including uterus, ovaries, adnexal regions, and pelvic cul-de-sac.  It was necessary to proceed with endovaginal exam following the transabdominal exam to visualize the left ovary in greater detail. Color and duplex Doppler ultrasound was utilized to evaluate blood flow to the ovaries.  COMPARISON:  CT of the abdomen and pelvis from earlier today at 3:13 a.m.  FINDINGS: Uterus  Measurements: 7.2 x 4.1 x 4.6 cm. No fibroids or other mass visualized.  Endometrium  Thickness: 0.8 cm. A small amount of complex fluid is seen within the endometrial canal.  Right ovary  Status post right-sided oophorectomy.  Left ovary  Measurements: 3.4 x 2.7 x 2.1 cm. There appears to be a complex cystic region with low-level echoes measuring 5.2 x 3.1 x 5.1 cm, arising adjacent to the ovary. Doppler evaluation demonstrates arterial waveforms with respect to this region; this is concerning for a mass.  Pulsed Doppler evaluation of both ovaries demonstrates normal low-resistance arterial and venous waveforms.  Other findings  A moderate amount of diffusely septated fluid is seen predominantly about the left ovary, with trace complex fluid on the right side. The appearance is atypical for hydrosalpinx; this may simply reflect loculation of pelvic fluid.  IMPRESSION: 1.  Complex cystic region with low-level echoes, measuring 5.2 x 3.1 x 5.1 cm, arising adjacent to the left ovary. Doppler evaluation demonstrates arterial waveforms with respect to this region, raising concern for a mass. Consider surgical evaluation. 2. Moderate amount of diffusely septated fluid noted about the left ovary, with trace complex fluid on the right. The appearance is atypical for hydrosalpinx; this may simply reflect loculation of pelvic fluid. 3. Small amount of complex fluid noted within the endometrial canal.   Electronically Signed   By: Garald Balding M.D.   On: 10/16/2013 07:17   US Pelvis Complete  10/16/2013   CLINICAL DATA:  Left pelvic pain. Question of ruptured ovarian cyst on CT.  EXAM: TRANSABDOMINAL AND TRANSVAGINAL ULTRASOUND OF PELVIS  DOPPLER ULTRASOUND OF OVARIES  TECHNIQUE: Both transabdominal and transvaginal ultrasound examinations of the pelvis were performed. Transabdominal technique was performed for global imaging of the pelvis including uterus, ovaries, adnexal regions, and pelvic cul-de-sac.  It was necessary to proceed with endovaginal exam following the transabdominal exam to visualize the left  ovary in greater detail. Color and duplex Doppler ultrasound was utilized to evaluate blood flow to the ovaries.  COMPARISON:  CT of the abdomen and pelvis from earlier today at 3:13 a.m.  FINDINGS: Uterus  Measurements: 7.2 x 4.1 x 4.6 cm. No fibroids or other mass visualized.  Endometrium  Thickness: 0.8 cm. A small amount of complex fluid is seen within the endometrial canal.  Right ovary  Status post right-sided oophorectomy.  Left ovary  Measurements: 3.4 x 2.7 x 2.1 cm. There appears to be a complex cystic region with low-level echoes measuring 5.2 x 3.1 x 5.1 cm, arising adjacent to the ovary. Doppler evaluation demonstrates arterial waveforms with respect to this region; this is concerning for a mass.  Pulsed Doppler evaluation of both ovaries demonstrates normal  low-resistance arterial and venous waveforms.  Other findings  A moderate amount of diffusely septated fluid is seen predominantly about the left ovary, with trace complex fluid on the right side. The appearance is atypical for hydrosalpinx; this may simply reflect loculation of pelvic fluid.  IMPRESSION: 1. Complex cystic region with low-level echoes, measuring 5.2 x 3.1 x 5.1 cm, arising adjacent to the left ovary. Doppler evaluation demonstrates arterial waveforms with respect to this region, raising concern for a mass. Consider surgical evaluation. 2. Moderate amount of diffusely septated fluid noted about the left ovary, with trace complex fluid on the right. The appearance is atypical for hydrosalpinx; this may simply reflect loculation of pelvic fluid. 3. Small amount of complex fluid noted within the endometrial canal.   Electronically Signed   By: Garald Balding M.D.   On: 10/16/2013 07:17   Ct Abdomen Pelvis W Contrast  10/16/2013   CLINICAL DATA:  Left lower quadrant abdominal pain for 6 months. Central abdominal firmness and bilateral tenderness.  EXAM: CT ABDOMEN AND PELVIS WITH CONTRAST  TECHNIQUE: Multidetector CT imaging of the abdomen and pelvis was performed using the standard protocol following bolus administration of intravenous contrast.  CONTRAST:  100 mL of Omnipaque 300 IV contrast  COMPARISON:  CT ABDOMEN WO/W CM dated 07/12/2008; MR ABDOMEN WO/W CM dated 07/12/2008; US ABDOMEN COMPLETE dated 05/13/2009  FINDINGS: The visualized lung bases are clear.  The liver and spleen are unremarkable in appearance. The gallbladder is within normal limits. The pancreas and left adrenal gland are unremarkable.  The kidneys are unremarkable in appearance. There is no evidence of hydronephrosis. No renal or ureteral stones are seen. No perinephric stranding is appreciated. A clip is noted adjacent to the upper pole of the right kidney.  No free fluid is identified. The small bowel is unremarkable in  appearance. The stomach is within normal limits. No acute vascular abnormalities are seen.  The appendix is normal in caliber and contains contrast, without evidence for appendicitis. Contrast progresses to the level of the rectum. The colon is unremarkable in appearance.  The bladder is mildly distended and grossly unremarkable in appearance. The uterus is somewhat heterogeneous and difficult to fully characterize. A multilobulated cystic focus is noted at the left adnexa, and there is a small amount of fluid of borderline simple attenuation tracking about the left adnexa and pelvis. This may reflect a ruptured left ovarian cyst. Pyosalpinx is considered less likely given the lack of leukocytosis. No inguinal lymphadenopathy is seen.  No acute osseous abnormalities are identified.  IMPRESSION: 1. Multilobulated cystic focus at the left adnexa, with a small amount of fluid of borderline simple attenuation tracking about the left adnexa and pelvis. This  raises concern for a recently ruptured left ovarian cyst. Pyosalpinx is considered less likely given the lack of leukocytosis. 2. The uterus appears somewhat heterogeneous; this is nonspecific.   Electronically Signed   By: Garald Balding M.D.   On: 10/16/2013 04:46   [place] Art/ven Flow Abd Pelv Doppler  10/16/2013   CLINICAL DATA:  Left pelvic pain. Question of ruptured ovarian cyst on CT.  EXAM: TRANSABDOMINAL AND TRANSVAGINAL ULTRASOUND OF PELVIS  DOPPLER ULTRASOUND OF OVARIES  TECHNIQUE: Both transabdominal and transvaginal ultrasound examinations of the pelvis were performed. Transabdominal technique was performed for global imaging of the pelvis including uterus, ovaries, adnexal regions, and pelvic cul-de-sac.  It was necessary to proceed with endovaginal exam following the transabdominal exam to visualize the left ovary in greater detail. Color and duplex Doppler ultrasound was utilized to evaluate blood flow to the ovaries.  COMPARISON:  CT of the abdomen and  pelvis from earlier today at 3:13 a.m.  FINDINGS: Uterus  Measurements: 7.2 x 4.1 x 4.6 cm. No fibroids or other mass visualized.  Endometrium  Thickness: 0.8 cm. A small amount of complex fluid is seen within the endometrial canal.  Right ovary  Status post right-sided oophorectomy.  Left ovary  Measurements: 3.4 x 2.7 x 2.1 cm. There appears to be a complex cystic region with low-level echoes measuring 5.2 x 3.1 x 5.1 cm, arising adjacent to the ovary. Doppler evaluation demonstrates arterial waveforms with respect to this region; this is concerning for a mass.  Pulsed Doppler evaluation of both ovaries demonstrates normal low-resistance arterial and venous waveforms.  Other findings  A moderate amount of diffusely septated fluid is seen predominantly about the left ovary, with trace complex fluid on the right side. The appearance is atypical for hydrosalpinx; this may simply reflect loculation of pelvic fluid.  IMPRESSION: 1. Complex cystic region with low-level echoes, measuring 5.2 x 3.1 x 5.1 cm, arising adjacent to the left ovary. Doppler evaluation demonstrates arterial waveforms with respect to this region, raising concern for a mass. Consider surgical evaluation. 2. Moderate amount of diffusely septated fluid noted about the left ovary, with trace complex fluid on the right. The appearance is atypical for hydrosalpinx; this may simply reflect loculation of pelvic fluid. 3. Small amount of complex fluid noted within the endometrial canal.   Electronically Signed   By: Garald Balding M.D.   On: 10/16/2013 07:17    EKG Interpretation   None       MDM   Final diagnoses:  Pelvic pain  Pelvic mass    Her CT/US showed some fluid in the left pelvis. No obvious hydrosalpinx. This was from her study dated 2/24.  She has not had fever. Recheck her white count and urinalysis. Apart from this, I think this will be a matter of pain control. He states she is applied for disability because he is "learning  disabled and I can't concentrate". Also states that she has applied for [facility], but that she does not qualify. She states "I've been asking people money to send me to the Doctor".  The scan is normal. Urine shows no signs of infection. She is sleeping after IV pain meds. Had a long discussion with her. She's been given multiple referrals to GYN. Given her information regarding rocking in [place], [place] and wellness center. I have asked her to continue pursuing options for healthcare insurance including the government portals, and, [place].  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 She expresses understanding of this. I do not feel this is an acute life-threatening or surgical emergency today.    Tanna Furry, MD 10/17/13 [phone]

## 2013-10-17 NOTE — Discharge Instructions (Signed)
You should see an OB/GYN to discuss additional testing oro treatment for the cystic mass noted on your Ultrasound 2 days ago (Feb 23). Failure to follow up can lead to worsening symptoms, or enlargement of the mass.   Pelvic Pain, Female Female pelvic pain can be caused by many different things and start from a variety of places. Pelvic pain refers to pain that is located in the lower half of the abdomen and between your hips. The pain may occur over a short period of time (acute) or may be reoccurring (chronic). The cause of pelvic pain may be related to disorders affecting the female reproductive organs (gynecologic), but it may also be related to the bladder, kidney stones, an intestinal complication, or muscle or skeletal problems. Getting help right away for pelvic pain is important, especially if there has been severe, sharp, or a sudden onset of unusual pain. It is also important to get help right away because some types of pelvic pain can be life threatening.  CAUSES  Below are only some of the causes of pelvic pain. The causes of pelvic pain can be in one of several categories.   Gynecologic.  Pelvic inflammatory disease.  Sexually transmitted infection.  Ovarian cyst or a twisted ovarian ligament (ovarian torsion).  Uterine lining that grows outside the uterus (endometriosis).  Fibroids, cysts, or tumors.  Ovulation.  Pregnancy.  Pregnancy that occurs outside the uterus (ectopic pregnancy).  Miscarriage.  Labor.  Abruption of the placenta or ruptured uterus.  Infection.  Uterine infection (endometritis).  Bladder infection.  Diverticulitis.  Miscarriage related to a uterine infection (septic abortion).  Bladder.  Inflammation of the bladder (cystitis).  Kidney stone(s).  Gastrointenstinal.  Constipation.  Diverticulitis.  Neurologic.  Trauma.  Feeling pelvic pain because of mental or emotional causes (psychosomatic).  Cancers of the bowel or  pelvis. EVALUATION  Your caregiver will want to take a careful history of your concerns. This includes recent changes in your health, a careful gynecologic history of your periods (menses), and a sexual history. Obtaining your family history and medical history is also important. Your caregiver may suggest a pelvic exam. A pelvic exam will help identify the location and severity of the pain. It also helps in the evaluation of which organ system may be involved. In order to identify the cause of the pelvic pain and be properly treated, your caregiver may order tests. These tests may include:   A pregnancy test.  Pelvic ultrasonography.  An X-ray exam of the abdomen.  A urinalysis or evaluation of vaginal discharge.  Blood tests. HOME CARE INSTRUCTIONS   Only take over-the-counter or prescription medicines for pain, discomfort, or fever as directed by your caregiver.   Rest as directed by your caregiver.   Eat a balanced diet.   Drink enough fluids to make your urine clear or pale yellow, or as directed.   Avoid sexual intercourse if it causes pain.   Apply warm or cold compresses to the lower abdomen depending on which one helps the pain.   Avoid stressful situations.   Keep a journal of your pelvic pain. Write down when it started, where the pain is located, and if there are things that seem to be associated with the pain, such as food or your menstrual cycle.  Follow up with your caregiver as directed.  SEEK MEDICAL CARE IF:  Your medicine does not help your pain.  You have abnormal vaginal discharge. SEEK IMMEDIATE MEDICAL CARE IF:   You have  heavy bleeding from the vagina.   Your pelvic pain increases.   You feel lightheaded or faint.   You have chills.   You have pain with urination or blood in your urine.   You have uncontrolled diarrhea or vomiting.   You have a fever or persistent symptoms for more than 3 days.  You have a fever and your  symptoms suddenly get worse.   You are being physically or sexually abused.  MAKE SURE YOU:  Understand these instructions.  Will watch your condition.  Will get help if you are not doing well or get worse. Document Released: 07/06/2004 Document Revised: 02/08/2012 Document Reviewed: 11/29/2011 [facility] Patient Information 2014 [place], [place].  Pelvic Mass A "mass" is a lump that either your caregiver found during an examination or you found before seeing your caregiver. The "pelvis" is the lower portion of the trunk in between the hip bones. There are many possible reasons why a lump has appeared. Testing will help determine the cause and the steps to a solution. CAUSES  Before complete testing is done, it may be difficult or impossible for your caregiver to know if the lump is truly in one of the pelvic organs (such as the uterus or ovaries) or is coming from one of many organs that are near the pelvis. Problems in the colon or kidney can also lead to a lump that might seem to be in the pelvis. If testing shows that the mass is in the pelvis, there are still many possible causes:  Tumors and cancers. These problems are relatively common and are the greatest source of worry for patients. Cancerous lumps in the pelvis may be due to cancers that started in the uterus or ovaries or due to cancers that started in other areas and then spread to the pelvis. Many cancers are very treatable when found early.  Non-cancerous tumors and masses. There are a large number of common and uncommon non-cancerous problems that can lead to a mass in the pelvis. Two very common ones are fibroids of the uterus and ovarian cysts. Before testing and/or surgery, it may be impossible to tell the difference between these problems and a cancer.  Infection. Certain types of infections can produce a mass in the pelvis. The infection might be caused by bacteria. If there is an infection treatment might include  antibiotics. Masses from infection can also be caused by certain viruses, and in rare cases, by fungi or parasites. If infection is the cause, your caregiver will be able to determine the type of germ responsible for the mass by doing appropriate testing.  Inflammatory bowel disease. These are diseases thought to be caused by a defect in the immune system of the intestine. There are two inflammatory bowel diseases: Ulcerative Colitis and Crohn's Disease. They are lifelong problems with symptoms that can come and go. Sometimes, patients with these diseases will develop a mass in the lower part of the colon that can make it seem as though there is a mass in the pelvis.  Past Surgery. If there has been pelvic surgery in the past, and there is a lot of scarring that forms during the process of healing, this can eventually fell like a mass when examined by your caregiver. As with the other problems described above, this may or may not be associated with symptoms or feeling badly.  Ectopic Pregnancy. This is a condition where the growing fetus is growing outside of the uterus. This is a common cause for  a pelvic mass and may become a serious or life-threatening problem that requires immediate surgery. SYMPTOMS  In people with a pelvic mass, there may be a large variety of associated symptoms including:   No symptoms, other than the appearance of the mass itself.  Cramping, nausea, diarrhea.  Fever, vomiting, weakness.  Pelvic, side, and/or back pain.  Weight loss.  Constipation.  Problems with vaginal bleeding. This can be very variable. Bleeding might be very light or very heavy. Bleeding may be mixed with large clots. Menstruation may be very frequent and may seem to almost completely stop. There may be varying levels of pain with menstruation.  Urinary symptoms including frequent urination, inability to empty the bladder completely, or urinating very small amounts. DIAGNOSIS  Because of the  large number of causes of a mass in the pelvis, your caregiver will ask you to undergo testing in order to get a clear diagnosis in a timely manner. The tests might include some or all of the following:  Blood tests such as a blood count, measurement of common minerals in the blood, kidney/liver/pancreas function, pregnancy test, and others.  X-rays. Plain x-rays and special x-rays may be requested except if you are pregnant.  Ultrasound. This is a test that uses sound waves to "paint a picture" of the mass. The type of "sound picture" that is seen can help to narrow the diagnosis.  CAT scan and MRI imaging. Each can provide additional information as to the different characteristics of the mass and can help to develop a final plan for diagnostics and treatment. If cancer is suspected, these special tests can also help to show any spread of the cancer to other parts of the body. It is possible that these tests may not be ordered if you are pregnant.  Laparoscopy. This is a special exam of the inside of the pelvic area using a slim, flexible, lighted tube. This allows your caregiver to get a direct look at the mass. Sometimes, this allows getting a very small piece of the mass (a biopsy). This piece of tissue can then be examined in a lab that will frequently lead to a clear diagnosis. In some cases, your caregiver can use a laparoscope to completely remove the mass after it has been examined.  Surgery. Sometimes, a diagnosis can only be made by carrying out an operation and obtaining a biopsy (as noted above). Many times, the biopsy is obtained and the mass is removed during the same operation. These are the most common ways for determining the exact cause of the mass. Your caregiver may recommend other tests that are not listed here. TREATMENT  Treatment(s) can only be recommended after a diagnosis is made. Your caregiver will discuss your test results with you, the meanings of the tests, and the  recommended steps to begin treatment. He/she will also recommend whether you need to be examined by specialists as you go through the steps of diagnosis and a treatment plan is developed. HOME CARE INSTRUCTIONS   Test preparation. Carefully follow instructions when preparing for certain tests. This may involve many things such as:  Drinking fluids to fill the bladder before a pelvic ultrasound.  Fasting before certain blood tests.  Drinking special "contrast" fluids that are necessary for obtaining the best CAT scan and MRI images.  Medications. Your caregiver may prescribe medications to help relieve symptoms while you undergo testing. It is important that your current medications (prescription, non-prescription, herbal, vitamins, etc.) be kept in mind when new  prescriptions are recommended.  Diet. There may be a need for changes in diet in order to help with symptom relief while testing is being done. If this applies to you, your caregiver will discuss these changes with you. SEEK MEDICAL CARE IF:   You cannot hold down any of the recommended fluids used to prepare for tests such as CAT scan MRI.  You feel that you are having trouble with any new prescriptions.  You develop new symptoms of pain, vomiting, diarrhea, fever, or other problems that you did not feel since your last exam.  You experience inability to empty your bladder completely or develop painful and/or bloody urination. SEEK IMMEDIATE MEDICAL CARE IF:   You vomit bright red blood, or a coffee ground appearing material.  You have blood in your stools, or the stools turn black and tarry.  You have an abnormal or increased amount of vaginal bleeding.  You have a fever.  You develop easy bruising or bleeding.  You develop pain that is not controlled by your medication.  You feel worsening weakness or you have a fainting episode.  You feel that the mass has suddenly gotten larger.  You develop severe bloating in  the abdomen and/or pelvis.  You cannot pass any urine. MAKE SURE YOU:   Understand these instructions.  Will watch your condition.  Will get help right away if you are not doing well or get worse. Document Released: 11/16/2006 Document Revised: 11/01/2011 Document Reviewed: 07/25/2007 [facility] Patient Information 2014 [place], [place].

## 2013-10-23 ENCOUNTER — Ambulatory Visit (INDEPENDENT_AMBULATORY_CARE_PROVIDER_SITE_OTHER): Payer: Self-pay | Admitting: Obstetrics and Gynecology

## 2013-10-23 ENCOUNTER — Encounter: Payer: Self-pay | Admitting: Obstetrics and Gynecology

## 2013-10-23 VITALS — BP 104/58 | Ht 66.0 in | Wt 180.2 lb

## 2013-10-23 DIAGNOSIS — N898 Other specified noninflammatory disorders of vagina: Secondary | ICD-10-CM

## 2013-10-23 DIAGNOSIS — N760 Acute vaginitis: Secondary | ICD-10-CM

## 2013-10-23 LAB — POCT WET PREP (WET MOUNT): Trichomonas Wet Prep HPF POC: NEGATIVE

## 2013-10-23 MED ORDER — METRONIDAZOLE 500 MG PO TABS: 500.0000 mg | ORAL_TABLET | Freq: Two times a day (BID) | ORAL | Status: DC

## 2013-10-23 NOTE — Patient Instructions (Signed)
drawin the Ca-125 test will rule out ovarian cancer.  I do not think cancer is very likely given your age, lack of symptoms , and complaints. Please decide where you wish to be seen. Since you live in [place] there may be an affordable start. Our records of todays visit will be available thru any [facility] facility. [facility] can accesss the records.

## 2013-10-23 NOTE — Progress Notes (Signed)
Patient ID: Colleen Ortiz, female   DOB: 02/20/1991, 23 y.o.   MRN: [id number]   [facility] Visit  Patient name: Colleen Ortiz MRN [id number]  Date of birth: 02/12/1991  CC & HPI:  Colleen Ortiz is a 23 y.o. female presenting today for "my left side be hurting all the time.: seen at [facility] 3.2.15, had u/s.  Has the disc from [facility]. This 22 year old female gravida 0 para 0 not sexually active x1 year "I don't like sex" seen at [facility] 10/19/2013 for lower abdominal pain. Ultrasound was notable for a complex cystic and septated left adnexal mass that appears below the left ovary demonstrating internal debris that measures 5.4 x 3.2 x 4.6 cm then additional complex cystic mass superior to the left ovary 6.9 x 5.6 x 4.6 cm with mild to moderate pelvic and paraovarian free fluid Past GYN history is notable for emergency salpingo-oophorectomy on the right for apparent ovarian torsion performed 2013, in [place]. Patient denies history of sexually transmitted infections. Patient describes the pelvis is having lots of scar tissue and it was ". quite difficult to identify the uterus at the time of surgery" Patient recently has moved back from [place]. Patient was begun on Depo-Provera 3-4 weeks ago at [place]. A she will be living and [place] in the future.     ROS:  See history of present illness  Pertinent History Reviewed:  Medical & Surgical Hx:  Reviewed: Significant for 2013 removal of right tube and ovary for torsion in the emergency surgery. Medications: Reviewed & Updated - see associated section Social History: Reviewed -  reports that she has been smoking Cigarettes.  She has been smoking about 0.50 packs per day. She has never used smokeless tobacco.  Objective Findings:  Vitals: BP 104/58  Ht 5\' 6"  (1.676 m)  Wt 180 lb 3.2 oz (81.738 kg)  BMI 29.10 kg/m2  LMP 10/01/2013  Physical Examination: General appearance - alert, well  appearing, and in no distress and oriented to person, place, and time Mental status - alert, oriented to person, place, and time, normal mood, behavior, speech, dress, motor activity, and thought processes Heart - normal rate and regular rhythm Abdomen - soft, nontender, nondistended, no masses or organomegaly Pelvic - normal external genitalia, vulva, vagina, cervix, uterus and adnexa, VAGINA: normal appearing vagina with normal color and discharge, no lesions, PELVIC FLOOR EXAM: no cystocele, rectocele or prolapse noted, CERVIX: normal appearing cervix without discharge or lesions, UTERUS: uterus is normal size, shape, consistency and nontender, vaginal discharge she is positive for clue cells,  ADNEXA: surgically absent right, left side does not have a very distinct mass effect.    Assessment & Plan:   1. Bacterial vaginosis. 2. 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 Menstrual control with Depo-Provera advised to continue 4. Poor communicator Plan: 1. Metronidazole 500 by mouth twice a day x7 days 2. Obtain CA 125 pt to delay due to cost issues 3. Patient to decide living location issues and to seek care in [place] or [place], and establish care. If patient wishes to come here we'll see her back in 6 weeks for repeat ultrasound. Patient is to seek affordable care act  coverage in the interim.

## 2013-10-29 ENCOUNTER — Other Ambulatory Visit: Payer: Self-pay

## 2013-11-02 ENCOUNTER — Telehealth: Payer: Self-pay | Admitting: Obstetrics and Gynecology

## 2013-11-02 MED ORDER — HYDROCODONE-ACETAMINOPHEN 5-325 MG PO TABS: 2.0000 | ORAL_TABLET | ORAL | Status: DC | PRN

## 2013-11-02 NOTE — Telephone Encounter (Signed)
Spoke with pt. Pt states she has ovarian cysts that is causing pain. Pt is requesting pain med. Thanks!!!

## 2013-11-03 NOTE — Telephone Encounter (Signed)
Rx for a 3 day supply of Vicodin done, and faxed to [facility]. Hard copy to be mailed.

## 2013-11-05 ENCOUNTER — Other Ambulatory Visit: Payer: Self-pay

## 2013-11-05 ENCOUNTER — Telehealth: Payer: Self-pay | Admitting: *Deleted

## 2013-11-05 DIAGNOSIS — Z8041 Family history of malignant neoplasm of ovary: Secondary | ICD-10-CM

## 2013-11-05 NOTE — Telephone Encounter (Signed)
Pt states having a discharge now that she was not having a last visit, would like to be seen today for this by Dr. Glo Herring and would like to have her "blood work done for cancer." Pt informed did not have an available appt today with Dr. Glo Herring but could do blood work today asked if she would be willing to see another provider. Pt stated would prefer to see Dr. Glo Herring. Appointment made with Dr. Glo Herring for Wednesday, March 18,2015 at 11 am per front staff.

## 2013-11-06 ENCOUNTER — Telehealth: Payer: Self-pay | Admitting: *Deleted

## 2013-11-06 LAB — CA 125: CA 125: 30.1 U/mL (ref 0.0–30.2)

## 2013-11-07 ENCOUNTER — Encounter: Payer: Self-pay | Admitting: Obstetrics and Gynecology

## 2013-11-07 ENCOUNTER — Ambulatory Visit (INDEPENDENT_AMBULATORY_CARE_PROVIDER_SITE_OTHER): Payer: Self-pay | Admitting: Obstetrics and Gynecology

## 2013-11-07 VITALS — BP 120/80 | Ht 66.0 in | Wt 179.0 lb

## 2013-11-07 DIAGNOSIS — N83209 Unspecified ovarian cyst, unspecified side: Secondary | ICD-10-CM

## 2013-11-07 DIAGNOSIS — N736 Female pelvic peritoneal adhesions (postinfective): Secondary | ICD-10-CM

## 2013-11-07 NOTE — Patient Instructions (Signed)
Keep [facility] appt May 8th. Call [phone] for [facility] Physician Referral List

## 2013-11-07 NOTE — Telephone Encounter (Signed)
Pt saw Dr. Glo Herring on 11/07/13 to discuss labs.

## 2013-11-07 NOTE — Progress Notes (Signed)
This chart was scribed by Jenne Campus, Medical Scribe, for Dr. Mallory Shirk on 11/07/13 at 11:19 AM. This chart was reviewed by Dr. Mallory Shirk and is accurate.    [facility] Visit  Patient name: Colleen Ortiz MRN [id number]  Date of birth: 03-29-1991  CC & HPI:  [name] is a 23 y.o. female presenting today for discussion of CA 125 results of 30. Unsure when LNMP, possibly last month. On Depo injections, started one month ago. Reports vaginal discharge yesterday that is resolved and ongoing bilateral RLQ and LLQ pain, usually worse on the left.   ROS:  +vaginal discharge, resolved +bilateral RLQ and LLQ pain, usually worse on LLQ  No other sxs  Pertinent History Reviewed:  Medical & Surgical Hx:  Reviewed: Significant for ?right salping-oophorectomy in [place], [place] Medications: Reviewed & Updated - see associated section Social History: Reviewed -  reports that she has been smoking Cigarettes.  She has been smoking about 0.50 packs per day. She has never used smokeless tobacco.  Objective Findings:  Vitals: BP 120/80  Ht 5\' 6"  (1.676 m)  Wt 179 lb (81.194 kg)  BMI 28.91 kg/m2  LMP 10/01/2013 Chaperone present for exam which was performed with pt's permission Physical Examination: General appearance - alert, well appearing, and in no distress and oriented to person, place, and time Mental status - alert, oriented to person, place, and time, normal mood, behavior, speech, dress, motor activity, and thought processes Pelvic - VULVA: normal appearing vulva with no masses, tenderness or lesions, VAGINA: normal appearing vagina with normal color and discharge, no lesions, CERVIX: normal appearing cervix without discharge or lesions, UTERUS: uterus is normal size, shape, consistency and nontender, ADNEXA: tenderness bilateral, large cyst palpated on left ovary   Assessment & Plan:  A: 1. Left ovarian cyst 2. Pelvic adhesions 3. CA 125 within the upper limits,  not indicative of cancer  P: 1. Pt advised to f/u with [facility] providers  2. Continue Depo  3. Keep [facility] appt May 8th

## 2013-11-08 ENCOUNTER — Other Ambulatory Visit: Payer: Self-pay

## 2013-11-19 ENCOUNTER — Emergency Department (HOSPITAL_COMMUNITY)
Admission: EM | Admit: 2013-11-19 | Discharge: 2013-11-20 | Disposition: A | Discharge status: Home / Self Care | Payer: Medicaid - Out of State | Attending: Emergency Medicine | Admitting: Emergency Medicine

## 2013-11-19 ENCOUNTER — Encounter (HOSPITAL_COMMUNITY): Payer: Self-pay | Admitting: Emergency Medicine

## 2013-11-19 ENCOUNTER — Emergency Department (HOSPITAL_COMMUNITY): Payer: Medicaid - Out of State

## 2013-11-19 DIAGNOSIS — N938 Other specified abnormal uterine and vaginal bleeding: Secondary | ICD-10-CM | POA: Insufficient documentation

## 2013-11-19 DIAGNOSIS — N949 Unspecified condition associated with female genital organs and menstrual cycle: Secondary | ICD-10-CM | POA: Insufficient documentation

## 2013-11-19 DIAGNOSIS — R0789 Other chest pain: Secondary | ICD-10-CM

## 2013-11-19 DIAGNOSIS — F17200 Nicotine dependence, unspecified, uncomplicated: Secondary | ICD-10-CM | POA: Insufficient documentation

## 2013-11-19 DIAGNOSIS — R102 Pelvic and perineal pain: Secondary | ICD-10-CM

## 2013-11-19 DIAGNOSIS — Z3202 Encounter for pregnancy test, result negative: Secondary | ICD-10-CM | POA: Insufficient documentation

## 2013-11-19 DIAGNOSIS — N939 Abnormal uterine and vaginal bleeding, unspecified: Secondary | ICD-10-CM

## 2013-11-19 DIAGNOSIS — G8929 Other chronic pain: Secondary | ICD-10-CM

## 2013-11-19 DIAGNOSIS — R0602 Shortness of breath: Secondary | ICD-10-CM | POA: Insufficient documentation

## 2013-11-19 DIAGNOSIS — Z79899 Other long term (current) drug therapy: Secondary | ICD-10-CM | POA: Insufficient documentation

## 2013-11-19 DIAGNOSIS — R42 Dizziness and giddiness: Secondary | ICD-10-CM | POA: Insufficient documentation

## 2013-11-19 DIAGNOSIS — N83202 Unspecified ovarian cyst, left side: Secondary | ICD-10-CM

## 2013-11-19 DIAGNOSIS — Z9079 Acquired absence of other genital organ(s): Secondary | ICD-10-CM | POA: Insufficient documentation

## 2013-11-19 DIAGNOSIS — F411 Generalized anxiety disorder: Secondary | ICD-10-CM | POA: Insufficient documentation

## 2013-11-19 DIAGNOSIS — N83209 Unspecified ovarian cyst, unspecified side: Secondary | ICD-10-CM | POA: Insufficient documentation

## 2013-11-19 HISTORY — DX: Other chronic pain: G89.29

## 2013-11-19 HISTORY — DX: Pelvic and perineal pain: R10.2

## 2013-11-19 HISTORY — DX: Anxiety disorder, unspecified: F41.9

## 2013-11-19 HISTORY — DX: Dorsalgia, unspecified: M54.9

## 2013-11-19 MED ORDER — LORAZEPAM 2 MG/ML IJ SOLN
0.5000 mg | Freq: Once | INTRAMUSCULAR | Status: AC
  Administered 2013-11-19: 0.5 mg via INTRAVENOUS
  Filled 2013-11-19: qty 1

## 2013-11-19 MED ORDER — KETOROLAC TROMETHAMINE 30 MG/ML IJ SOLN
30.0000 mg | Freq: Once | INTRAMUSCULAR | Status: AC
  Administered 2013-11-19: 30 mg via INTRAVENOUS
  Filled 2013-11-19: qty 1

## 2013-11-19 NOTE — ED Notes (Signed)
Patient complaining of vaginal bleeding x 3 days, worsened today. Also complains of chest pain, dizziness, confusion, and nausea.

## 2013-11-19 NOTE — ED Provider Notes (Addendum)
CSN: [id number]     Arrival date & time 11/19/13  2208 History  This chart was scribed for Colleen Ortiz. Dorna Mai, MD by Zettie Pho, ED Scribe. This patient was seen in room APA06/APA06 and the patient's care was started at 11:12 PM.    Chief Complaint  Patient presents with  . Vaginal Bleeding  . Dizziness   The history is provided by the patient. No language interpreter was used.   HPI Comments: Colleen Ortiz is a 23 y.o. Female with a history of left-sided ovarian cyst, right-sided ovarian torsion with right oophorectomy, and chronic pelvic pain who presents to the Emergency Department complaining of a constant pain to the left side of the lower abdomen. Patient reports associated vaginal bleeding, described as being "dark and old blood," that she states has been ongoing for the past 7 months and resolved for 2 months, but since returned 3 days ago and has been progressively worsening. Patient reports that she has been evaluated (OBGYN is Dr. Glo Herring) for these complaints and received an injection of Depo-Provera (last on 09/27/2013, due in May), which she states was temporarily effective at alleviating her vaginal bleeding. Patient states that she is not currently sexually active.   Patient is also complaining of some intermittent, left-sided chest pain, around the left pectoral, with associated shortness of breath and mild dizziness onset last night while at rest. She denies any potential injury or trauma to the area. Patient denies taking any medications at home to treat her symptoms. Patient reports that she went to donate plasma last week and was told that her "blood counts were low," and expresses concern for anemia. She denies cough, appetite change. She denies any recent extended travel/prolonged periods of immobilization. She reports an allergy to Tylenol #3. Patient is a current, every day smoker, 0.5 PPD.  Past Medical History  Diagnosis Date  . Ovarian cyst   . Benign tumor of adrenal  gland   . Chronic pelvic pain in female   . Chronic back pain   . Anxiety    Past Surgical History  Procedure Laterality Date  . Oophorectomy Right    Family History  Problem Relation Age of Onset  . Diabetes Maternal Grandmother   . Heart disease Maternal Grandmother   . Diabetes Maternal Grandfather   . Heart disease Maternal Grandfather    History  Substance Use Topics  . Smoking status: Current Every Day Smoker -- 0.50 packs/day    Types: Cigarettes  . Smokeless tobacco: Never Used  . Alcohol Use: No   OB History   Grav Para Term Preterm Abortions TAB SAB Ect Mult Living                 Review of Systems  A complete 10 system review of systems was obtained and all systems are negative except as noted in the HPI and PMH.    Allergies  Tylenol with codeine #3  Home Medications   Current Outpatient Rx  Name  Route  Sig  Dispense  Refill  . albuterol (PROVENTIL HFA;VENTOLIN HFA) 108 (90 BASE) MCG/ACT inhaler   Inhalation   Inhale 2 puffs into the lungs every 6 (six) hours as needed for wheezing or shortness of breath.         . citalopram (CELEXA) 10 MG tablet   Oral   Take 10 mg by mouth every morning.          . gabapentin (NEURONTIN) 300 MG capsule   Oral  Take 300 mg by mouth 3 (three) times daily.         Marland Kitchen ibuprofen (ADVIL,MOTRIN) 800 MG tablet   Oral   Take 800 mg by mouth every 8 (eight) hours as needed.         Marland Kitchen HYDROcodone-acetaminophen (NORCO) 5-325 MG per tablet   Oral   Take 2 tablets by mouth every 4 (four) hours as needed for moderate pain or severe pain.   25 tablet   0    Triage Vitals: BP 121/56  Pulse 68  Temp(Src) 97.6 F (36.4 C) (Oral)  Resp 20  Ht 5\' 6"  (1.676 m)  Wt 180 lb (81.647 kg)  BMI 29.07 kg/m2  SpO2 99%  LMP 11/16/2013  Physical Exam  Nursing note and vitals reviewed. Constitutional: She is oriented to person, place, and time. She appears well-developed and well-nourished. No distress.  HENT:   Head: Normocephalic and atraumatic.  Eyes: Conjunctivae are normal.  Neck: Normal range of motion. Neck supple.  Cardiovascular: Normal rate, regular rhythm and normal heart sounds.   Pulmonary/Chest: Effort normal and breath sounds normal. No respiratory distress. She exhibits no tenderness.  No reproducible tenderness to palpation to the chest.   Abdominal: Soft. She exhibits no distension. There is tenderness. There is no rebound and no guarding.  Tenderness to palpation to the LLQ.   Genitourinary: Vagina normal. There is no tenderness or lesion on the right labia. There is no tenderness or lesion on the left labia. Cervix exhibits no motion tenderness. Right adnexum displays no mass and no tenderness. Left adnexum displays tenderness. Left adnexum displays no mass.  Examination chaperoned    scant amount of brownish, likely old blood in vaginal vault.  No active bleeding seen, no lesions   Musculoskeletal: Normal range of motion.  Neurological: She is alert and oriented to person, place, and time.  Skin: Skin is warm and dry.  Psychiatric: She has a normal mood and affect. Her behavior is normal.    ED Course  Procedures (including critical care time)  DIAGNOSTIC STUDIES: Oxygen Saturation is 99% on room air, normal by my interpretation.    COORDINATION OF CARE: 11:25 PM- Ordered an EKG and a urine pregnancy. Will order CBC, BMP. Will perform a pelvic exam and order GC/chlamydia testing. Will order Toradol and Ativan to manage symptoms. Discussed treatment plan with patient at bedside and patient verbalized agreement.     Labs Review Labs Reviewed  WET PREP, GENITAL - Abnormal; Notable for the following:    Clue Cells Wet Prep HPF POC MODERATE (*)    WBC, Wet Prep HPF POC MODERATE (*)    All other components within normal limits  CBC - Abnormal; Notable for the following:    RBC 3.83 (*)    HCT 35.4 (*)    All other components within normal limits  BASIC METABOLIC  PANEL - Abnormal; Notable for the following:    Glucose, Bld 102 (*)    All other components within normal limits  GC/CHLAMYDIA PROBE AMP  PREGNANCY, URINE   Imaging Review Dg Chest 2 View  11/20/2013   CLINICAL DATA:  One day history of chest pain and shortness of breath  EXAM: CHEST  2 VIEW  COMPARISON:  Prior chest x-ray and CT scan of the chest 07/11/2008  FINDINGS: The lungs are clear and negative for focal airspace consolidation, pulmonary edema or suspicious pulmonary nodule. No pleural effusion or pneumothorax. Cardiac and mediastinal contours are within normal limits. No acute  fracture or lytic or blastic osseous lesions. The visualized upper abdominal bowel gas pattern is unremarkable.  IMPRESSION: No active cardiopulmonary disease.   Electronically Signed   By: Malachy Moan M.D.   On: 11/20/2013 01:17   US Transvaginal Non-ob  11/20/2013   CLINICAL DATA:  Pain.  Evaluate for torsion.  EXAM: TRANSABDOMINAL AND TRANSVAGINAL ULTRASOUND OF PELVIS  DOPPLER ULTRASOUND OF OVARIES  TECHNIQUE: Both transabdominal and transvaginal ultrasound examinations of the pelvis were performed. Transabdominal technique was performed for global imaging of the pelvis including uterus, ovaries, adnexal regions, and pelvic cul-de-sac.  It was necessary to proceed with endovaginal exam following the transabdominal exam to visualize the ovaries and adnexa. Color and duplex Doppler ultrasound was utilized to evaluate blood flow to the ovaries.  COMPARISON:  10/16/2013  FINDINGS: Uterus  Measurements: 7 x 4 x 5 cm. No fibroids or other mass visualized.  Endometrium  The endometrium is thin, measuring no more than 3 mm, but is separated by mid level echoes which are mobile. The appearance is similar to 10/16/2013.  Right ovary  Surgically absent.  Left ovary  Normal ovarian parenchyma measures 4 x 3.8 x 3.1 cm. Arising from or immediately adjacent to the left ovary is a 5 x 3 x 3 cm predominately nonvascular structure  with homogeneous mid level echoes throughout. There is a blood vessel or septation along the upper margin. Appearance is unchanged from previous.  Pulsed Doppler evaluation of the remaining left ovary demonstrates normal low-resistance arterial and venous waveforms.  Other findings  There is loculated fluid within the right and left pelvis with mid level echoes. The overall volume is similar to that seen previous.  IMPRESSION: 1. Negative for left ovarian torsion (status post right salpingo oophorectomy) or other acute pelvic finding. 2. Indeterminate 5 cm left adnexal mass, favor endometrioma based on the internal echotexture. Given possible vascularized septation, consider nonemergent/outpatient pelvic MRI. 3. Bilateral, chronic loculated pelvic fluid collections. If there is endometriosis, these likely represent peritoneal inclusion cysts. If there is history of pelvic inflammatory disease, these could represent chronic abscess. 4. Hemorrhage or other debris distends the uterine cavity. Given similar appearance 10/16/2013, question cervical stenosis.   Electronically Signed   By: Tiburcio Pea M.D.   On: 11/20/2013 01:11   US Pelvis Complete  11/20/2013   CLINICAL DATA:  Pain.  Evaluate for torsion.  EXAM: TRANSABDOMINAL AND TRANSVAGINAL ULTRASOUND OF PELVIS  DOPPLER ULTRASOUND OF OVARIES  TECHNIQUE: Both transabdominal and transvaginal ultrasound examinations of the pelvis were performed. Transabdominal technique was performed for global imaging of the pelvis including uterus, ovaries, adnexal regions, and pelvic cul-de-sac.  It was necessary to proceed with endovaginal exam following the transabdominal exam to visualize the ovaries and adnexa. Color and duplex Doppler ultrasound was utilized to evaluate blood flow to the ovaries.  COMPARISON:  10/16/2013  FINDINGS: Uterus  Measurements: 7 x 4 x 5 cm. No fibroids or other mass visualized.  Endometrium  The endometrium is thin, measuring no more than 3 mm,  but is separated by mid level echoes which are mobile. The appearance is similar to 10/16/2013.  Right ovary  Surgically absent.  Left ovary  Normal ovarian parenchyma measures 4 x 3.8 x 3.1 cm. Arising from or immediately adjacent to the left ovary is a 5 x 3 x 3 cm predominately nonvascular structure with homogeneous mid level echoes throughout. There is a blood vessel or septation along the upper margin. Appearance is unchanged from previous.  Pulsed Doppler  evaluation of the remaining left ovary demonstrates normal low-resistance arterial and venous waveforms.  Other findings  There is loculated fluid within the right and left pelvis with mid level echoes. The overall volume is similar to that seen previous.  IMPRESSION: 1. Negative for left ovarian torsion (status post right salpingo oophorectomy) or other acute pelvic finding. 2. Indeterminate 5 cm left adnexal mass, favor endometrioma based on the internal echotexture. Given possible vascularized septation, consider nonemergent/outpatient pelvic MRI. 3. Bilateral, chronic loculated pelvic fluid collections. If there is endometriosis, these likely represent peritoneal inclusion cysts. If there is history of pelvic inflammatory disease, these could represent chronic abscess. 4. Hemorrhage or other debris distends the uterine cavity. Given similar appearance 10/16/2013, question cervical stenosis.   Electronically Signed   By: Jorje Guild M.D.   On: 11/20/2013 01:11   [place] Art/ven Flow Abd Pelv Doppler  11/20/2013   CLINICAL DATA:  Pain.  Evaluate for torsion.  EXAM: TRANSABDOMINAL AND TRANSVAGINAL ULTRASOUND OF PELVIS  DOPPLER ULTRASOUND OF OVARIES  TECHNIQUE: Both transabdominal and transvaginal ultrasound examinations of the pelvis were performed. Transabdominal technique was performed for global imaging of the pelvis including uterus, ovaries, adnexal regions, and pelvic cul-de-sac.  It was necessary to proceed with endovaginal exam following the  transabdominal exam to visualize the ovaries and adnexa. Color and duplex Doppler ultrasound was utilized to evaluate blood flow to the ovaries.  COMPARISON:  10/16/2013  FINDINGS: Uterus  Measurements: 7 x 4 x 5 cm. No fibroids or other mass visualized.  Endometrium  The endometrium is thin, measuring no more than 3 mm, but is separated by mid level echoes which are mobile. The appearance is similar to 10/16/2013.  Right ovary  Surgically absent.  Left ovary  Normal ovarian parenchyma measures 4 x 3.8 x 3.1 cm. Arising from or immediately adjacent to the left ovary is a 5 x 3 x 3 cm predominately nonvascular structure with homogeneous mid level echoes throughout. There is a blood vessel or septation along the upper margin. Appearance is unchanged from previous.  Pulsed Doppler evaluation of the remaining left ovary demonstrates normal low-resistance arterial and venous waveforms.  Other findings  There is loculated fluid within the right and left pelvis with mid level echoes. The overall volume is similar to that seen previous.  IMPRESSION: 1. Negative for left ovarian torsion (status post right salpingo oophorectomy) or other acute pelvic finding. 2. Indeterminate 5 cm left adnexal mass, favor endometrioma based on the internal echotexture. Given possible vascularized septation, consider nonemergent/outpatient pelvic MRI. 3. Bilateral, chronic loculated pelvic fluid collections. If there is endometriosis, these likely represent peritoneal inclusion cysts. If there is history of pelvic inflammatory disease, these could represent chronic abscess. 4. Hemorrhage or other debris distends the uterine cavity. Given similar appearance 10/16/2013, question cervical stenosis.   Electronically Signed   By: Jorje Guild M.D.   On: 11/20/2013 01:11     EKG Interpretation   Date/Time:  Monday November 19 2013 22:32:38 EDT Ventricular Rate:  68 PR Interval:  146 QRS Duration: 74 QT Interval:  396 QTC Calculation:  421 R Axis:   59 Text Interpretation:  Normal sinus rhythm Normal ECG When compared with  ECG of 22-Oct-2010 23:06, No significant change was found Confirmed by  [facility]  MD, MICHEAL ([id number]) on 11/20/2013 12:12:29 AM       Initially not much improvement with toradol, given IV dilaudid with better pain relief.  U/S shows the same cyst, no acute changes or worse  findings.  Pt told of results.  ECG unremarkable.  CXR shows no abn's.  Will d./c home and pt encouraged to follow up with PMD.  Pt is out of the norco that she often takes for her chronic pain.  3 day supply provided.    MDM   Final diagnoses:  Atypical chest pain  Left ovarian cyst  Abnormal vaginal bleeding  Chronic pelvic pain in female    I personally performed the services described in this documentation, which was scribed in my presence. The recorded information has been reviewed and considered.    Pt with 2 separate complaints.  Pt's vaginal bleeding and issues with left pelvic pain is chronic, I reviewed last ED notes, prior U/S and Dr. Johnnye Sima notes.  Pt however reports that pain is identicial to what occurred when she had her right side torsion.  Given the complex cyst, she is certainly at risk for torsion.  Last time, she did not present with N/V adn has none now.  Depsite pelvic exam not displaying much tenderness, pt has been taking Norco which may mask some of the pain, she is unfamiliar to me, I feel obligated to perform anotehr U/S to r/o torsion.  Her CP is atypical, sharp but not pleuritic, may be more related to anxiety.  Will get ECG, routine labs, CXR.  She is PERC negative for PE and she is on depo only, no estrogen.         Colleen Ortiz. Dorna Mai, MD 11/20/13 [place] Dorna Mai, MD 11/20/13 [phone]

## 2013-11-20 ENCOUNTER — Emergency Department (HOSPITAL_COMMUNITY): Payer: Medicaid - Out of State

## 2013-11-20 LAB — BASIC METABOLIC PANEL
BUN: 12 mg/dL (ref 6–23)
CO2: 25 mEq/L (ref 19–32)
Calcium: 9.1 mg/dL (ref 8.4–10.5)
Creatinine, Ser: 0.78 mg/dL (ref 0.50–1.10)
GFR calc Af Amer: >90 mL/min (ref >90)

## 2013-11-20 LAB — WET PREP, GENITAL
Trich, Wet Prep: NONE SEEN
Yeast Wet Prep HPF POC: NONE SEEN

## 2013-11-20 LAB — CBC
HCT: 35.4 % — ABNORMAL LOW (ref 36.0–46.0)
Hemoglobin: 12 g/dL (ref 12.0–15.0)
MCH: 31.3 pg (ref 26.0–34.0)
MCHC: 33.9 g/dL (ref 30.0–36.0)
MCV: 92.4 fL (ref 78.0–100.0)
Platelets: 260 10*3/uL (ref 150–400)
RBC: 3.83 MIL/uL — ABNORMAL LOW (ref 3.87–5.11)
RDW: 12.3 % (ref 11.5–15.5)
WBC: 9.2 K/uL (ref 4.0–10.5)

## 2013-11-20 LAB — BASIC METABOLIC PANEL WITH GFR
Chloride: 105 meq/L (ref 96–112)
GFR calc non Af Amer: >90 mL/min (ref >90)
Glucose, Bld: 102 mg/dL — ABNORMAL HIGH (ref 70–99)
Potassium: 4.4 meq/L (ref 3.7–5.3)
Sodium: 141 meq/L (ref 137–147)

## 2013-11-20 LAB — PREGNANCY, URINE: Preg Test, Ur: NEGATIVE

## 2013-11-20 MED ORDER — HYDROMORPHONE HCL PF 1 MG/ML IJ SOLN
1.0000 mg | Freq: Once | INTRAMUSCULAR | Status: AC
  Administered 2013-11-20: 1 mg via INTRAVENOUS
  Filled 2013-11-20: qty 1

## 2013-11-20 MED ORDER — HYDROCODONE-ACETAMINOPHEN 5-325 MG PO TABS: 2.0000 | ORAL_TABLET | ORAL | Status: DC | PRN

## 2013-11-20 NOTE — Discharge Instructions (Signed)
Pelvic Pain, Female °Female pelvic pain can be caused by many different things and start from a variety of places. Pelvic pain refers to pain that is located in the lower half of the abdomen and between your hips. The pain may occur over a short period of time (acute) or may be reoccurring (chronic). The cause of pelvic pain may be related to disorders affecting the female reproductive organs (gynecologic), but it may also be related to the bladder, kidney stones, an intestinal complication, or muscle or skeletal problems. Getting help right away for pelvic pain is important, especially if there has been severe, sharp, or a sudden onset of unusual pain. It is also important to get help right away because some types of pelvic pain can be life threatening.  °CAUSES  °Below are only some of the causes of pelvic pain. The causes of pelvic pain can be in one of several categories.  °· Gynecologic. °· Pelvic inflammatory disease. °· Sexually transmitted infection. °· Ovarian cyst or a twisted ovarian ligament (ovarian torsion). °· Uterine lining that grows outside the uterus (endometriosis). °· Fibroids, cysts, or tumors. °· Ovulation. °· Pregnancy. °· Pregnancy that occurs outside the uterus (ectopic pregnancy). °· Miscarriage. °· Labor. °· Abruption of the placenta or ruptured uterus. °· Infection. °· Uterine infection (endometritis). °· Bladder infection. °· Diverticulitis. °· Miscarriage related to a uterine infection (septic abortion). °· Bladder. °· Inflammation of the bladder (cystitis). °· Kidney stone(s). °· Gastrointenstinal. °· Constipation. °· Diverticulitis. °· Neurologic. °· Trauma. °· Feeling pelvic pain because of mental or emotional causes (psychosomatic). °· Cancers of the bowel or pelvis. °EVALUATION  °Your caregiver will want to take a careful history of your concerns. This includes recent changes in your health, a careful gynecologic history of your periods (menses), and a sexual history. Obtaining  your family history and medical history is also important. Your caregiver may suggest a pelvic exam. A pelvic exam will help identify the location and severity of the pain. It also helps in the evaluation of which organ system may be involved. In order to identify the cause of the pelvic pain and be properly treated, your caregiver may order tests. These tests may include:  °· A pregnancy test. °· Pelvic ultrasonography. °· An X-ray exam of the abdomen. °· A urinalysis or evaluation of vaginal discharge. °· Blood tests. °HOME CARE INSTRUCTIONS  °· Only take over-the-counter or prescription medicines for pain, discomfort, or fever as directed by your caregiver.   °· Rest as directed by your caregiver.   °· Eat a balanced diet.   °· Drink enough fluids to make your urine clear or pale yellow, or as directed.   °· Avoid sexual intercourse if it causes pain.   °· Apply warm or cold compresses to the lower abdomen depending on which one helps the pain.   °· Avoid stressful situations.   °· Keep a journal of your pelvic pain. Write down when it started, where the pain is located, and if there are things that seem to be associated with the pain, such as food or your menstrual cycle. °· Follow up with your caregiver as directed.   °SEEK MEDICAL CARE IF: °· Your medicine does not help your pain. °· You have abnormal vaginal discharge. °SEEK IMMEDIATE MEDICAL CARE IF:  °· You have heavy bleeding from the vagina.   °· Your pelvic pain increases.   °· You feel lightheaded or faint.   °· You have chills.   °· You have pain with urination or blood in your urine.   °· You have uncontrolled   diarrhea or vomiting.   You have a fever or persistent symptoms for more than 3 days.  You have a fever and your symptoms suddenly get worse.   You are being physically or sexually abused.  MAKE SURE YOU:  Understand these instructions.  Will watch your condition.  Will get help if you are not doing well or get worse. Document  Released: 07/06/2004 Document Revised: 02/08/2012 Document Reviewed: 11/29/2011 [facility] Patient Information 2014 [place], [place].     Ovarian Cyst An ovarian cyst is a fluid-filled sac that forms on an ovary. The ovaries are small organs that produce eggs in women. Various types of cysts can form on the ovaries. Most are not cancerous. Many do not cause problems, and they often go away on their own. Some may cause symptoms and require treatment. Common types of ovarian cysts include:  Functional cysts These cysts may occur every month during the menstrual cycle. This is normal. The cysts usually go away with the next menstrual cycle if the woman does not get pregnant. Usually, there are no symptoms with a functional cyst.  Endometrioma cysts These cysts form from the tissue that lines the uterus. They are also called "chocolate cysts" because they become filled with blood that turns brown. This type of cyst can cause pain in the lower abdomen during intercourse and with your menstrual period.  Cystadenoma cysts This type develops from the cells on the outside of the ovary. These cysts can get very big and cause lower abdomen pain and pain with intercourse. This type of cyst can twist on itself, cut off its blood supply, and cause severe pain. It can also easily rupture and cause a lot of pain.  Dermoid cysts This type of cyst is sometimes found in both ovaries. These cysts may contain different kinds of body tissue, such as skin, teeth, hair, or cartilage. They usually do not cause symptoms unless they get very big.  Theca lutein cysts These cysts occur when too much of a certain hormone (human chorionic gonadotropin) is produced and overstimulates the ovaries to produce an egg. This is most common after procedures used to assist with the conception of a baby (in vitro fertilization). CAUSES   Fertility drugs can cause a condition in which multiple large cysts are formed on the ovaries. This is  called ovarian hyperstimulation syndrome.  A condition called polycystic ovary syndrome can cause hormonal imbalances that can lead to nonfunctional ovarian cysts. SIGNS AND SYMPTOMS  Many ovarian cysts do not cause symptoms. If symptoms are present, they may include:  Pelvic pain or pressure.  Pain in the lower abdomen.  Pain during sexual intercourse.  Increasing girth (swelling) of the abdomen.  Abnormal menstrual periods.  Increasing pain with menstrual periods.  Stopping having menstrual periods without being pregnant. DIAGNOSIS  These cysts are commonly found during a routine or annual pelvic exam. Tests may be ordered to find out more about the cyst. These tests may include:  Ultrasound.  X-ray of the pelvis.  CT scan.  MRI.  Blood tests. TREATMENT  Many ovarian cysts go away on their own without treatment. Your health care provider may want to check your cyst regularly for 2 3 months to see if it changes. For women in menopause, it is particularly important to monitor a cyst closely because of the higher rate of ovarian cancer in menopausal women. When treatment is needed, it may include any of the following:  A procedure to drain the cyst (aspiration). This  may be done using a long needle and ultrasound. It can also be done through a laparoscopic procedure. This involves using a thin, lighted tube with a tiny camera on the end (laparoscope) inserted through a small incision.  Surgery to remove the whole cyst. This may be done using laparoscopic surgery or an open surgery involving a larger incision in the lower abdomen.  Hormone treatment or birth control pills. These methods are sometimes used to help dissolve a cyst. HOME CARE INSTRUCTIONS   Only take over-the-counter or prescription medicines as directed by your health care provider.  Follow up with your health care provider as directed.  Get regular pelvic exams and Pap tests. SEEK MEDICAL CARE IF:   Your  periods are late, irregular, or painful, or they stop.  Your pelvic pain or abdominal pain does not go away.  Your abdomen becomes larger or swollen.  You have pressure on your bladder or trouble emptying your bladder completely.  You have pain during sexual intercourse.  You have feelings of fullness, pressure, or discomfort in your stomach.  You lose weight for no apparent reason.  You feel generally ill.  You become constipated.  You lose your appetite.  You develop acne.  You have an increase in body and facial hair.  You are gaining weight, without changing your exercise and eating habits.  You think you are pregnant. SEEK IMMEDIATE MEDICAL CARE IF:   You have increasing abdominal pain.  You feel sick to your stomach (nauseous), and you throw up (vomit).  You develop a fever that comes on suddenly.  You have abdominal pain during a bowel movement.  Your menstrual periods become heavier than usual. Document Released: 08/09/2005 Document Revised: 05/30/2013 Document Reviewed: 04/16/2013 [facility] Patient Information 2014 [place].    Chest Pain (Nonspecific) It is often hard to give a specific diagnosis for the cause of chest pain. There is always a chance that your pain could be related to something serious, such as a heart attack or a blood clot in the lungs. You need to follow up with your caregiver for further evaluation. CAUSES   Heartburn.  Pneumonia or bronchitis.  Anxiety or stress.  Inflammation around your heart (pericarditis) or lung (pleuritis or pleurisy).  A blood clot in the lung.  A collapsed lung (pneumothorax). It can develop suddenly on its own (spontaneous pneumothorax) or from injury (trauma) to the chest.  Shingles infection (herpes zoster virus). The chest wall is composed of bones, muscles, and cartilage. Any of these can be the source of the pain.  The bones can be bruised by injury.  The muscles or cartilage can be  strained by coughing or overwork.  The cartilage can be affected by inflammation and become sore (costochondritis). DIAGNOSIS  Lab tests or other studies, such as X-rays, electrocardiography, stress testing, or cardiac imaging, may be needed to find the cause of your pain.  TREATMENT   Treatment depends on what may be causing your chest pain. Treatment may include:  Acid blockers for heartburn.  Anti-inflammatory medicine.  Pain medicine for inflammatory conditions.  Antibiotics if an infection is present.  You may be advised to change lifestyle habits. This includes stopping smoking and avoiding alcohol, caffeine, and chocolate.  You may be advised to keep your head raised (elevated) when sleeping. This reduces the chance of acid going backward from your stomach into your esophagus.  Most of the time, nonspecific chest pain will improve within 2 to 3 days with rest and  mild pain medicine. HOME CARE INSTRUCTIONS   If antibiotics were prescribed, take your antibiotics as directed. Finish them even if you start to feel better.  For the next few days, avoid physical activities that bring on chest pain. Continue physical activities as directed.  Do not smoke.  Avoid drinking alcohol.  Only take over-the-counter or prescription medicine for pain, discomfort, or fever as directed by your caregiver.  Follow your caregiver's suggestions for further testing if your chest pain does not go away.  Keep any follow-up appointments you made. If you do not go to an appointment, you could develop lasting (chronic) problems with pain. If there is any problem keeping an appointment, you must call to reschedule. SEEK MEDICAL CARE IF:   You think you are having problems from the medicine you are taking. Read your medicine instructions carefully.  Your chest pain does not go away, even after treatment.  You develop a rash with blisters on your chest. SEEK IMMEDIATE MEDICAL CARE IF:   You have  increased chest pain or pain that spreads to your arm, neck, jaw, back, or abdomen.  You develop shortness of breath, an increasing cough, or you are coughing up blood.  You have severe back or abdominal pain, feel nauseous, or vomit.  You develop severe weakness, fainting, or chills.  You have a fever. THIS IS AN EMERGENCY. Do not wait to see if the pain will go away. Get medical help at once. Call your local emergency services (911 in [place] Do not drive yourself to the hospital. MAKE SURE YOU:   Understand these instructions.  Will watch your condition.  Will get help right away if you are not doing well or get worse. Document Released: 05/19/2005 Document Revised: 11/01/2011 Document Reviewed: 03/14/2008 [facility] Patient Information 2014 [place].

## 2013-11-22 LAB — GC/CHLAMYDIA PROBE AMP
CT Probe RNA: NEGATIVE
GC Probe RNA: NEGATIVE

## 2013-12-03 ENCOUNTER — Encounter: Payer: Self-pay | Admitting: Obstetrics and Gynecology

## 2013-12-03 ENCOUNTER — Ambulatory Visit (INDEPENDENT_AMBULATORY_CARE_PROVIDER_SITE_OTHER): Payer: Self-pay | Admitting: Obstetrics and Gynecology

## 2013-12-03 VITALS — BP 128/76 | Ht 66.0 in | Wt 176.0 lb

## 2013-12-03 DIAGNOSIS — N926 Irregular menstruation, unspecified: Secondary | ICD-10-CM

## 2013-12-03 DIAGNOSIS — N939 Abnormal uterine and vaginal bleeding, unspecified: Secondary | ICD-10-CM

## 2013-12-03 MED ORDER — HYDROCODONE-ACETAMINOPHEN 7.5-325 MG PO TABS: 1.0000 | ORAL_TABLET | Freq: Four times a day (QID) | ORAL | Status: DC | PRN

## 2013-12-03 MED ORDER — CEFIXIME 400 MG PO TABS: 400.0000 mg | ORAL_TABLET | Freq: Every day | ORAL | Status: DC

## 2013-12-03 MED ORDER — AZITHROMYCIN 500 MG PO TABS: 1000.0000 mg | ORAL_TABLET | Freq: Every day | ORAL | Status: DC

## 2013-12-03 MED ORDER — HYDROCODONE-ACETAMINOPHEN 7.5-500 MG PO TABS: 1.0000 | ORAL_TABLET | Freq: Three times a day (TID) | ORAL | Status: DC | PRN

## 2013-12-03 MED ORDER — MEGESTROL ACETATE 40 MG PO TABS: 40.0000 mg | ORAL_TABLET | Freq: Every day | ORAL | Status: DC

## 2013-12-03 NOTE — Progress Notes (Signed)
Patient ID: Colleen Ortiz, female   DOB: 10-11-1990, 23 y.o.   MRN: [id number]   [facility] Visit  Patient name: Colleen Ortiz MRN [id number]  Date of birth: 04-17-1991  CC & HPI:  Colleen Ortiz is a 23 y.o. female presenting today for follow up for bilateral lower abdominal pain that she was seen for on 11/07/13. She also states she began having vaginal bleeding today. She has associated episodes of diaphoresis and nausea.    ROS:  +Vaginal bleeding +Bilateral lower abdominal pain, nausea    Pertinent History Reviewed:  Medical & Surgical Hx:  Reviewed: Significant for  Past Medical History  Diagnosis Date  . Ovarian cyst   . Benign tumor of adrenal gland   . Chronic pelvic pain in female   . Chronic back pain   . Anxiety     Past Surgical History  Procedure Laterality Date  . Oophorectomy Right   . Adrenalectomy      Medications: Reviewed & Updated - see associated section Social History: Reviewed -  reports that she has been smoking Cigarettes.  She has been smoking about 0.50 packs per day. She has never used smokeless tobacco.  Objective Findings:  Vitals: BP 128/76  Ht 5\' 6"  (1.676 m)  Wt 176 lb (79.833 kg)  BMI 28.42 kg/m2  LMP 11/16/2013  Physical Examination: General appearance - alert, well appearing, and in no distress and oriented to person, place, and time Pelvic -  VULVA: normal appearing vulva with no masses, tenderness or lesions,  VAGINA: normal appearing vagina with normal color and discharge, no lesions, vaginal side wall tenderness,  CERVIX: normal appearing cervix without discharge or lesions, Non purulent cervix UTERUS: uterus is normal size, shape, consistency and nontender,  ADNEXA: normal adnexa in size, no masses, diffuse tenderness to pelvic region without mass    Assessment & Plan:   A: 1. Chronic pelvic pain, no acute findings 2. Break through bleeding on depo   P: 1. Vicodin 7.5 x60 pills to last for 30 days  2.  Megace for BTB

## 2013-12-03 NOTE — Addendum Note (Signed)
Addended by: Jonnie Kind on: 12/03/2013 02:00 PM   Modules accepted: Orders

## 2013-12-28 ENCOUNTER — Telehealth: Payer: Self-pay

## 2013-12-28 NOTE — Telephone Encounter (Signed)
Pt requesting refill for Megace and Hydrocodone. Pt informed will need an appt with Dr. Glo Herring to refill Hydrocodone, last RX given on 12/03/2013 #60. Will route message for refill on Megace. Pt states going out of town on Sunday requesting refill on Megace before than.

## 2013-12-30 ENCOUNTER — Other Ambulatory Visit: Payer: Self-pay | Admitting: Obstetrics and Gynecology

## 2013-12-30 DIAGNOSIS — N939 Abnormal uterine and vaginal bleeding, unspecified: Secondary | ICD-10-CM

## 2013-12-30 MED ORDER — MEGESTROL ACETATE 40 MG PO TABS: 40.0000 mg | ORAL_TABLET | Freq: Every day | ORAL | Status: DC

## 2013-12-30 NOTE — Progress Notes (Signed)
Megacel refilled x 30 tabs.

## 2013-12-31 ENCOUNTER — Other Ambulatory Visit: Payer: Self-pay | Admitting: Obstetrics and Gynecology

## 2014-01-17 ENCOUNTER — Encounter (HOSPITAL_COMMUNITY): Payer: Self-pay | Admitting: Emergency Medicine

## 2014-01-17 ENCOUNTER — Emergency Department (HOSPITAL_COMMUNITY)
Admission: EM | Admit: 2014-01-17 | Discharge: 2014-01-18 | Disposition: A | Discharge status: Home / Self Care | Payer: Medicaid - Out of State | Attending: Emergency Medicine | Admitting: Emergency Medicine

## 2014-01-17 DIAGNOSIS — Z3202 Encounter for pregnancy test, result negative: Secondary | ICD-10-CM | POA: Insufficient documentation

## 2014-01-17 DIAGNOSIS — M545 Low back pain, unspecified: Secondary | ICD-10-CM | POA: Insufficient documentation

## 2014-01-17 DIAGNOSIS — R51 Headache: Secondary | ICD-10-CM | POA: Insufficient documentation

## 2014-01-17 DIAGNOSIS — G8929 Other chronic pain: Secondary | ICD-10-CM | POA: Insufficient documentation

## 2014-01-17 DIAGNOSIS — N949 Unspecified condition associated with female genital organs and menstrual cycle: Secondary | ICD-10-CM | POA: Insufficient documentation

## 2014-01-17 DIAGNOSIS — Z862 Personal history of diseases of the blood and blood-forming organs and certain disorders involving the immune mechanism: Secondary | ICD-10-CM | POA: Insufficient documentation

## 2014-01-17 DIAGNOSIS — R21 Rash and other nonspecific skin eruption: Secondary | ICD-10-CM | POA: Insufficient documentation

## 2014-01-17 DIAGNOSIS — R102 Pelvic and perineal pain: Secondary | ICD-10-CM

## 2014-01-17 DIAGNOSIS — Z79899 Other long term (current) drug therapy: Secondary | ICD-10-CM | POA: Insufficient documentation

## 2014-01-17 DIAGNOSIS — Z8659 Personal history of other mental and behavioral disorders: Secondary | ICD-10-CM | POA: Insufficient documentation

## 2014-01-17 DIAGNOSIS — Z9079 Acquired absence of other genital organ(s): Secondary | ICD-10-CM | POA: Insufficient documentation

## 2014-01-17 DIAGNOSIS — F17200 Nicotine dependence, unspecified, uncomplicated: Secondary | ICD-10-CM | POA: Insufficient documentation

## 2014-01-17 DIAGNOSIS — Z8639 Personal history of other endocrine, nutritional and metabolic disease: Secondary | ICD-10-CM | POA: Insufficient documentation

## 2014-01-17 LAB — URINALYSIS, ROUTINE W REFLEX MICROSCOPIC
Bilirubin Urine: NEGATIVE
Glucose, UA: NEGATIVE mg/dL
Hgb urine dipstick: NEGATIVE
Ketones, ur: NEGATIVE mg/dL
LEUKOCYTES UA: NEGATIVE
Nitrite: NEGATIVE
PROTEIN: NEGATIVE mg/dL
Specific Gravity, Urine: 1.015 (ref 1.005–1.030)
Urobilinogen, UA: 0.2 mg/dL (ref 0.0–1.0)
pH: 7 (ref 5.0–8.0)

## 2014-01-17 LAB — POC URINE PREG, ED: Preg Test, Ur: NEGATIVE

## 2014-01-17 NOTE — ED Notes (Signed)
My stomach is bothering me, I was given meds to help me stop bleeding and it did for a while now bleeding again.

## 2014-01-17 NOTE — ED Provider Notes (Signed)
CSN: [id number]     Arrival date & time 01/17/14  2139 History   First MD Initiated Contact with Patient 01/17/14 2237 This chart was scribed for Orpah Greek, * by Anastasia Pall, ED Scribe. This patient was seen in room APA02/APA02 and the patient's care was started at 10:41 PM.     Chief Complaint  Patient presents with  . Vaginal Bleeding   (Consider location/radiation/quality/duration/timing/severity/associated sxs/prior Treatment) The history is provided by the patient. No language interpreter was used.  HPI Comments: Colleen Ortiz is a 23 y.o. female who presents to the Emergency Department complaining of constant, bilateral, lower abdominal pain, that radiates to her back, with associated headache, onset earlier this evening. She reports being followed by [facility]. She states they gave her medication to stop her bleeding, which she took with relief for a while, but states her bleeding has started again despite the medication.   She also reports constantly itching rash over her bilateral forearms. She denies any new creams. She denies any other symptoms.   PCP - Jonnie Kind, MD   Past Medical History  Diagnosis Date  . Ovarian cyst   . Benign tumor of adrenal gland   . Chronic pelvic pain in female   . Chronic back pain   . Anxiety    Past Surgical History  Procedure Laterality Date  . Oophorectomy Right   . Adrenalectomy     Family History  Problem Relation Age of Onset  . Diabetes Maternal Grandmother   . Heart disease Maternal Grandmother   . Diabetes Maternal Grandfather   . Heart disease Maternal Grandfather    History  Substance Use Topics  . Smoking status: Current Every Day Smoker -- 0.50 packs/day    Types: Cigarettes  . Smokeless tobacco: Never Used  . Alcohol Use: No   OB History   Grav Para Term Preterm Abortions TAB SAB Ect Mult Living                 Review of Systems  Constitutional: Negative for fever.  Gastrointestinal:  Positive for abdominal pain (bilateral lower).  Genitourinary: Positive for vaginal bleeding.  Musculoskeletal: Positive for back pain (lower).  Skin: Positive for rash (itching, bilateral forearms).  Neurological: Positive for headaches.    Allergies  Tylenol with codeine #3  Home Medications   Prior to Admission medications   Medication Sig Start Date End Date Taking? Authorizing Provider  ibuprofen (ADVIL,MOTRIN) 800 MG tablet Take 800 mg by mouth every 8 (eight) hours as needed. pain   Yes Historical Provider, MD  megestrol (MEGACE) 40 MG tablet Take 40 mg by mouth daily. For breakthrough bleeding   Yes Historical Provider, MD  albuterol (PROVENTIL HFA;VENTOLIN HFA) 108 (90 BASE) MCG/ACT inhaler Inhale 2 puffs into the lungs every 6 (six) hours as needed for wheezing or shortness of breath.    Historical Provider, MD   BP 122/72  Pulse 67  Temp(Src) 98.6 F (37 C) (Oral)  Resp 22  Ht 5' 5.5" (1.664 m)  Wt 180 lb (81.647 kg)  BMI 29.49 kg/m2  SpO2 100%  LMP 01/08/2014 Physical Exam  Constitutional: She is oriented to person, place, and time. She appears well-developed and well-nourished. No distress.  HENT:  Head: Normocephalic and atraumatic.  Right Ear: Hearing normal.  Left Ear: Hearing normal.  Mouth/Throat: Mucous membranes are normal.  Eyes: Conjunctivae and EOM are normal. Pupils are equal, round, and reactive to light.  Neck: Normal range of motion. Neck  supple.  Cardiovascular: Normal rate, regular rhythm, S1 normal and S2 normal.   Pulmonary/Chest: Effort normal. No respiratory distress. She exhibits no tenderness.  Abdominal: Soft. Normal appearance. There is no hepatosplenomegaly. There is tenderness. There is no rebound, no guarding, no tenderness at McBurney's point and negative Murphy's sign. No hernia.  Genitourinary: Vagina normal. Cervix exhibits motion tenderness. Cervix exhibits no discharge. Right adnexum displays no mass and no tenderness. Left adnexum  displays no mass and no tenderness.  Musculoskeletal: Normal range of motion.  Neurological: She is alert and oriented to person, place, and time. She has normal strength. No cranial nerve deficit or sensory deficit. Coordination normal. GCS eye subscore is 4. GCS verbal subscore is 5. GCS motor subscore is 6.  Skin: Skin is warm, dry and intact. Rash noted. No cyanosis.  Psychiatric: She has a normal mood and affect. Her speech is normal and behavior is normal. Thought content normal.   ED Course  Procedures (including critical care time)  DIAGNOSTIC STUDIES: Oxygen Saturation is 100% on room air, normal by my interpretation.    COORDINATION OF CARE: 10:43 PM-Discussed treatment plan which includes UA and pelvic exam with pt at bedside and pt agreed to plan.   Results for orders placed during the hospital encounter of 01/17/14  URINALYSIS, ROUTINE W REFLEX MICROSCOPIC      Result Value Ref Range   Color, Urine YELLOW  YELLOW   APPearance CLEAR  CLEAR   Specific Gravity, Urine 1.015  1.005 - 1.030   pH 7.0  5.0 - 8.0   Glucose, UA NEGATIVE  NEGATIVE mg/dL   Hgb urine dipstick NEGATIVE  NEGATIVE   Bilirubin Urine NEGATIVE  NEGATIVE   Ketones, ur NEGATIVE  NEGATIVE mg/dL   Protein, ur NEGATIVE  NEGATIVE mg/dL   Urobilinogen, UA 0.2  0.0 - 1.0 mg/dL   Nitrite NEGATIVE  NEGATIVE   Leukocytes, UA NEGATIVE  NEGATIVE  POC URINE PREG, ED      Result Value Ref Range   Preg Test, Ur NEGATIVE  NEGATIVE   No results found.    EKG Interpretation None     Medications - No data to display MDM   Final diagnoses:  None    Present lower abdominal and pelvic discomfort. Patient reports a history of ongoing problems with pelvic pain. She reports ovarian cyst as well as unusual vaginal bleeding. She was on hormone replacement to stop the bleeding which worked for several weeks, now starting to bleed again. Pelvic exam today, however, did not show any bleeding. There is no discharge. She  did have cervical motion tenderness, but she tells me it is always there. I did take cultures. Patient offered no treatment for cervicitis, followup with Doctor Glo Herring.   I personally performed the services described in this documentation, which was scribed in my presence. The recorded information has been reviewed and is accurate.    Orpah Greek, MD 01/18/14 [phone]

## 2014-01-18 LAB — WET PREP, GENITAL
Trich, Wet Prep: NONE SEEN
Yeast Wet Prep HPF POC: NONE SEEN

## 2014-01-18 MED ORDER — DOXYCYCLINE HYCLATE 100 MG PO CAPS: 100.0000 mg | ORAL_CAPSULE | Freq: Two times a day (BID) | ORAL | Status: DC

## 2014-01-18 MED ORDER — CEFTRIAXONE SODIUM 250 MG IJ SOLR
250.0000 mg | Freq: Once | INTRAMUSCULAR | Status: AC
  Administered 2014-01-18: 250 mg via INTRAMUSCULAR
  Filled 2014-01-18: qty 250

## 2014-01-18 MED ORDER — HYDROCODONE-ACETAMINOPHEN 5-325 MG PO TABS
2.0000 | ORAL_TABLET | Freq: Once | ORAL | Status: AC
  Administered 2014-01-18: 2 via ORAL
  Filled 2014-01-18: qty 2

## 2014-01-18 MED ORDER — HYDROCODONE-ACETAMINOPHEN 5-325 MG PO TABS: 2.0000 | ORAL_TABLET | ORAL | Status: DC | PRN

## 2014-01-18 MED ORDER — IBUPROFEN 800 MG PO TABS: 800.0000 mg | ORAL_TABLET | Freq: Three times a day (TID) | ORAL | Status: DC

## 2014-01-18 MED ORDER — LIDOCAINE HCL (PF) 1 % IJ SOLN
INTRAMUSCULAR | Status: AC
  Administered 2014-01-18: 0.9 mL
  Filled 2014-01-18: qty 5

## 2014-01-19 LAB — GC/CHLAMYDIA PROBE AMP
CT Probe RNA: NEGATIVE
GC Probe RNA: NEGATIVE

## 2014-01-23 ENCOUNTER — Telehealth: Payer: Self-pay | Admitting: *Deleted

## 2014-01-23 MED ORDER — HYDROCODONE-ACETAMINOPHEN 5-325 MG PO TABS: 1.0000 | ORAL_TABLET | ORAL | Status: DC | PRN

## 2014-01-23 NOTE — Telephone Encounter (Signed)
Refilled hc 5/325 x 15 tabs to last til appt on 01/29/14

## 2014-01-23 NOTE — Telephone Encounter (Signed)
Pt states that she still has some bleeding that comes and goes, pt still taking megace. Pt states that she is having abdominal pain and she doesn't know why. Pt wants pain medication, just enough to get through to her appointment on 01/29/14.

## 2014-01-24 ENCOUNTER — Telehealth: Payer: Self-pay | Admitting: Obstetrics and Gynecology

## 2014-01-24 NOTE — Telephone Encounter (Signed)
Pt informed that medication has been refilled and that she must come and pick it up.

## 2014-01-25 DIAGNOSIS — R079 Chest pain, unspecified: Secondary | ICD-10-CM | POA: Insufficient documentation

## 2014-01-25 DIAGNOSIS — F17200 Nicotine dependence, unspecified, uncomplicated: Secondary | ICD-10-CM | POA: Insufficient documentation

## 2014-01-25 DIAGNOSIS — G8929 Other chronic pain: Secondary | ICD-10-CM | POA: Insufficient documentation

## 2014-01-26 ENCOUNTER — Encounter (HOSPITAL_COMMUNITY): Payer: Self-pay | Admitting: Emergency Medicine

## 2014-01-26 ENCOUNTER — Emergency Department (HOSPITAL_COMMUNITY)
Admission: EM | Admit: 2014-01-26 | Discharge: 2014-01-26 | Disposition: A | Discharge status: Home / Self Care | Payer: Medicaid - Out of State | Attending: Emergency Medicine | Admitting: Emergency Medicine

## 2014-01-26 NOTE — ED Notes (Signed)
Per registration pt left after triage.

## 2014-01-26 NOTE — ED Notes (Signed)
Pt c/o central chest pain that radiates straight through to back x one hour.

## 2014-01-29 ENCOUNTER — Encounter: Payer: Self-pay | Admitting: Obstetrics and Gynecology

## 2014-01-29 ENCOUNTER — Ambulatory Visit (INDEPENDENT_AMBULATORY_CARE_PROVIDER_SITE_OTHER): Payer: Self-pay | Admitting: Obstetrics and Gynecology

## 2014-01-29 VITALS — BP 102/60 | Ht 65.0 in | Wt 183.0 lb

## 2014-01-29 DIAGNOSIS — N83209 Unspecified ovarian cyst, unspecified side: Secondary | ICD-10-CM

## 2014-01-29 NOTE — Patient Instructions (Addendum)
Please keep your records and share them with new doctors and caregivers.   Leuprolide injection What is this medicine? LEUPROLIDE (loo PROE lide) is a man-made hormone. It is used to treat the symptoms of prostate cancer. This medicine may also be used to treat children with early onset of puberty. It may be used for other hormonal conditions. This medicine may be used for other purposes; ask your health care provider or pharmacist if you have questions. COMMON BRAND NAME(S): Lupron What should I tell my health care provider before I take this medicine? They need to know if you have any of these conditions: -diabetes -heart disease or previous heart attack -high blood pressure -high cholesterol -pain or difficulty passing urine -spinal cord metastasis -stroke -tobacco smoker -an unusual or allergic reaction to leuprolide, benzyl alcohol, other medicines, foods, dyes, or preservatives -pregnant or trying to get pregnant -breast-feeding How should I use this medicine? This medicine is for injection under the skin or into a muscle. You will be taught how to prepare and give this medicine. Use exactly as directed. Take your medicine at regular intervals. Do not take your medicine more often than directed. It is important that you put your used needles and syringes in a special sharps container. Do not put them in a trash can. If you do not have a sharps container, call your pharmacist or healthcare provider to get one. Talk to your pediatrician regarding the use of this medicine in children. While this medicine may be prescribed for children as young as 8 years for selected conditions, precautions do apply. Overdosage: If you think you have taken too much of this medicine contact a poison control center or emergency room at once. NOTE: This medicine is only for you. Do not share this medicine with others. What if I miss a dose? If you miss a dose, take it as soon as you can. If it is almost  time for your next dose, take only that dose. Do not take double or extra doses. What may interact with this medicine? Do not take this medicine with any of the following medications: -chasteberry This medicine may also interact with the following medications: -herbal or dietary supplements, like black cohosh or DHEA -female hormones, like estrogens or progestins and birth control pills, patches, rings, or injections -female hormones, like testosterone This list may not describe all possible interactions. Give your health care provider a list of all the medicines, herbs, non-prescription drugs, or dietary supplements you use. Also tell them if you smoke, drink alcohol, or use illegal drugs. Some items may interact with your medicine. What should I watch for while using this medicine? Visit your doctor or health care professional for regular checks on your progress. During the first week, your symptoms may get worse, but then will improve as you continue your treatment. You may get hot flashes, increased bone pain, increased difficulty passing urine, or an aggravation of nerve symptoms. Discuss these effects with your doctor or health care professional, some of them may improve with continued use of this medicine. Female patients may experience a menstrual cycle or spotting during the first 2 months of therapy with this medicine. If this continues, contact your doctor or health care professional. What side effects may I notice from receiving this medicine? Side effects that you should report to your doctor or health care professional as soon as possible: -allergic reactions like skin rash, itching or hives, swelling of the face, lips, or tongue -breathing problems -chest  pain -depression or memory disorders -pain in your legs or groin -pain at site where injected -severe headache -swelling of the feet and legs -visual changes -vomiting Side effects that usually do not require medical attention  (report to your doctor or health care professional if they continue or are bothersome): -breast swelling or tenderness -decrease in sex drive or performance -diarrhea -hot flashes -loss of appetite -muscle, joint, or bone pains -nausea -redness or irritation at site where injected -skin problems or acne This list may not describe all possible side effects. Call your doctor for medical advice about side effects. You may report side effects to FDA at 1-800-FDA-1088. Where should I keep my medicine? Keep out of the reach of children. Store below 25 degrees C (77 degrees F). Do not freeze. Protect from light. Do not use if it is not clear or if there are particles present. Throw away any unused medicine after the expiration date. NOTE: This sheet is a summary. It may not cover all possible information. If you have questions about this medicine, talk to your doctor, pharmacist, or health care provider.  2014, Elsevier/Gold Standard. (2008-12-24 13:26:20)

## 2014-01-29 NOTE — Progress Notes (Signed)
Patient ID: Colleen Ortiz, female   DOB: 19-Aug-1991, 23 y.o.   MRN: [id number]   [facility] Visit  Patient name: Colleen Ortiz MRN [id number]  Date of birth: 04-Oct-1990  CC & HPI:  Colleen Ortiz is a 23 y.o. female presenting today for 1. Just the pain that comes and goes, 2. The bleeding , cause i thought I was to stop bleeding.  Has been on Megace. Has no pain.  Was seen at [facility] yesterday. Had gyn exam then. Had u/s at [facility] May 8, was seen by Obgyn., Pt reports " they didn't give me nothin' ". NO records found in [facility] system thru [facility].  Received LUPRON Rx yesterday.   ROS:  Had u/s in March showing a cystic area in adnexa.  U/s was repeated at [facility] in May.  Pertinent History Reviewed:   Reviewed: Significant for neurontin, not being taken x "a few weeks."  Medical              Dr Franchot Gallo: [facility], [facility] has written pt's neurontin in the past.                 Surgical Hx:    Medications: Reviewed & Updated - see associated section Social History: Reviewed -  reports that she has been smoking Cigarettes.  She has been smoking about 0.50 packs per day. She has never used smokeless tobacco.  Objective Findings:  Vitals: BP 102/60  Ht 5\' 5"  (1.651 m)  Wt 183 lb (83.008 kg)  BMI 30.45 kg/m2  LMP 01/08/2014  Physical Examination: General appearance - alert, well appearing, and in no distress Mental status - alert, oriented to person, place, and time, normal mood, behavior, speech, dress, motor activity, and thought processes Abdomen - soft, nontender, nondistended, no masses or organomegaly Physical Examination: Pelvic - normal external genitalia, vulva, vagina, cervix, uterus and adnexa   Assessment & Plan:  r New start Lupron thru [facility] As per their advice, continue Megace x 2 more weeks Pt reports that Lupron will be continued x 8 months. Will ask pt to seek further care thru [place].

## 2014-02-03 ENCOUNTER — Encounter (HOSPITAL_COMMUNITY): Payer: Self-pay | Admitting: Emergency Medicine

## 2014-02-03 ENCOUNTER — Emergency Department (HOSPITAL_COMMUNITY)
Admission: EM | Admit: 2014-02-03 | Discharge: 2014-02-03 | Disposition: A | Discharge status: Home / Self Care | Payer: Medicaid - Out of State | Attending: Emergency Medicine | Admitting: Emergency Medicine

## 2014-02-03 DIAGNOSIS — G8929 Other chronic pain: Secondary | ICD-10-CM | POA: Insufficient documentation

## 2014-02-03 DIAGNOSIS — Z862 Personal history of diseases of the blood and blood-forming organs and certain disorders involving the immune mechanism: Secondary | ICD-10-CM | POA: Insufficient documentation

## 2014-02-03 DIAGNOSIS — F411 Generalized anxiety disorder: Secondary | ICD-10-CM | POA: Insufficient documentation

## 2014-02-03 DIAGNOSIS — Z79899 Other long term (current) drug therapy: Secondary | ICD-10-CM | POA: Insufficient documentation

## 2014-02-03 DIAGNOSIS — K029 Dental caries, unspecified: Secondary | ICD-10-CM | POA: Insufficient documentation

## 2014-02-03 DIAGNOSIS — F17200 Nicotine dependence, unspecified, uncomplicated: Secondary | ICD-10-CM | POA: Insufficient documentation

## 2014-02-03 DIAGNOSIS — K089 Disorder of teeth and supporting structures, unspecified: Secondary | ICD-10-CM | POA: Insufficient documentation

## 2014-02-03 DIAGNOSIS — Z8742 Personal history of other diseases of the female genital tract: Secondary | ICD-10-CM | POA: Insufficient documentation

## 2014-02-03 DIAGNOSIS — K0889 Other specified disorders of teeth and supporting structures: Secondary | ICD-10-CM

## 2014-02-03 DIAGNOSIS — Z8639 Personal history of other endocrine, nutritional and metabolic disease: Secondary | ICD-10-CM | POA: Insufficient documentation

## 2014-02-03 MED ORDER — HYDROMORPHONE HCL PF 1 MG/ML IJ SOLN
1.0000 mg | Freq: Once | INTRAMUSCULAR | Status: AC
  Administered 2014-02-03: 1 mg via INTRAMUSCULAR
  Filled 2014-02-03: qty 1

## 2014-02-03 MED ORDER — ONDANSETRON 8 MG PO TBDP
8.0000 mg | ORAL_TABLET | Freq: Once | ORAL | Status: AC
  Administered 2014-02-03: 8 mg via ORAL
  Filled 2014-02-03: qty 1

## 2014-02-03 MED ORDER — OXYCODONE-ACETAMINOPHEN 5-325 MG PO TABS: 1.0000 | ORAL_TABLET | ORAL | Status: DC | PRN

## 2014-02-03 NOTE — ED Notes (Signed)
Patient states that she is having pain and swelling in her mouth. States that she has a headache. Can't eat or drink because of the mouth pain.

## 2014-02-03 NOTE — ED Notes (Signed)
States she is being treated for a dental infection by her dentist. No redness or swelling noted in mouth. Pt states her head is splitting & hurts to open her mouth.

## 2014-02-03 NOTE — ED Notes (Signed)
I was seen at [facility] two days ago per pt. Said I had an infection in my throat per pt. Taking amoxil per pt.

## 2014-02-03 NOTE — Discharge Instructions (Signed)
Dental Pain A tooth ache may be caused by cavities (tooth decay). Cavities expose the nerve of the tooth to air and hot or cold temperatures. It may come from an infection or abscess (also called a boil or furuncle) around your tooth. It is also often caused by dental caries (tooth decay). This causes the pain you are having. DIAGNOSIS  Your caregiver can diagnose this problem by exam. TREATMENT   If caused by an infection, it may be treated with medications which kill germs (antibiotics) and pain medications as prescribed by your caregiver. Take medications as directed.  Only take over-the-counter or prescription medicines for pain, discomfort, or fever as directed by your caregiver.  Whether the tooth ache today is caused by infection or dental disease, you should see your dentist as soon as possible for further care. SEEK MEDICAL CARE IF: The exam and treatment you received today has been provided on an emergency basis only. This is not a substitute for complete medical or dental care. If your problem worsens or new problems (symptoms) appear, and you are unable to meet with your dentist, call or return to this location. SEEK IMMEDIATE MEDICAL CARE IF:   You have a fever.  You develop redness and swelling of your face, jaw, or neck.  You are unable to open your mouth.  You have severe pain uncontrolled by pain medicine. MAKE SURE YOU:   Understand these instructions.  Will watch your condition.  Will get help right away if you are not doing well or get worse. Document Released: 08/09/2005 Document Revised: 11/01/2011 Document Reviewed: 03/27/2008 [facility] Patient Information 2014 [place].   Finish the antibiotic prescribed by your dentist.  You may take the oxycodone in place of the the hydrocodone prescribed for pain relief.  This will make you drowsy - do not drive within 4 hours of taking this medication.

## 2014-02-03 NOTE — ED Notes (Signed)
Pt alert & oriented x4, stable gait. Patient given discharge instructions, paperwork & prescription(s). Patient  instructed to stop at the registration desk to finish any additional paperwork. Patient verbalized understanding. Pt left department w/ no further questions. 

## 2014-02-05 NOTE — ED Provider Notes (Signed)
CSN: [id number]     Arrival date & time 02/03/14  2115 History   First MD Initiated Contact with Patient 02/03/14 2139     Chief Complaint  Patient presents with  . Dental Pain     (Consider location/radiation/quality/duration/timing/severity/associated sxs/prior Treatment) The history is provided by the patient.    Colleen Ortiz is a 23 y.o. female presenting with a 2 day history of dental and mouth pain.  She was seen by her dentist in [place] 2 days ago and is being treated for a dental abscess with amoxil but continues to have mouth pain.  She is anticipating dental extraction once the infection is resolved.   The patient has a history of  decay in the tooth involved which has recently started to cause increased  pain.  There has been no fevers, chills, nausea or vomiting, also no complaint of difficulty swallowing, although eating,chewing and swallowing makes pain worse.  She endorses generalized headache. The patient has tried ibuprofen  And prescribed hydrocodone without relief of symptoms.      Past Medical History  Diagnosis Date  . Ovarian cyst   . Benign tumor of adrenal gland   . Chronic pelvic pain in female   . Chronic back pain   . Anxiety    Past Surgical History  Procedure Laterality Date  . Oophorectomy Right   . Adrenalectomy     Family History  Problem Relation Age of Onset  . Diabetes Maternal Grandmother   . Heart disease Maternal Grandmother   . Diabetes Maternal Grandfather   . Heart disease Maternal Grandfather    History  Substance Use Topics  . Smoking status: Current Every Day Smoker -- 0.50 packs/day    Types: Cigarettes  . Smokeless tobacco: Never Used  . Alcohol Use: No   OB History   Grav Para Term Preterm Abortions TAB SAB Ect Mult Living   0 0             Review of Systems  Constitutional: Negative for fever.  HENT: Positive for dental problem. Negative for facial swelling and sore throat.   Respiratory: Negative for shortness  of breath.   Musculoskeletal: Negative for neck pain and neck stiffness.      Allergies  Tylenol with codeine #3  Home Medications   Prior to Admission medications   Medication Sig Start Date End Date Taking? Authorizing Provider  albuterol (PROVENTIL HFA;VENTOLIN HFA) 108 (90 BASE) MCG/ACT inhaler Inhale 2 puffs into the lungs every 6 (six) hours as needed for wheezing or shortness of breath.    Historical Provider, MD  ascorbic Acid (VITAMIN C) 500 MG CPCR Take 500 mg by mouth daily.    Historical Provider, MD  Calcium Carbonate-Vit D-Min (CALCIUM 1200 PO) Take by mouth.    Historical Provider, MD  doxycycline (VIBRAMYCIN) 100 MG capsule Take 1 capsule (100 mg total) by mouth 2 (two) times daily. 01/18/14   Orpah Greek, MD  Gabapentin (NEURONTIN PO) Take by mouth.    Historical Provider, MD  HYDROcodone-acetaminophen (NORCO/VICODIN) 5-325 MG per tablet Take 1-2 tablets by mouth every 4 (four) hours as needed for moderate pain. 01/23/14   Jonnie Kind, MD  ibuprofen (ADVIL,MOTRIN) 800 MG tablet Take 800 mg by mouth every 8 (eight) hours as needed. pain    Historical Provider, MD  ibuprofen (ADVIL,MOTRIN) 800 MG tablet Take 1 tablet (800 mg total) by mouth 3 (three) times daily. 01/18/14   Orpah Greek, MD  megestrol (MEGACE)  40 MG tablet Take 40 mg by mouth daily. For breakthrough bleeding    Historical Provider, MD  oxyCODONE-acetaminophen (PERCOCET/ROXICET) 5-325 MG per tablet Take 1 tablet by mouth every 4 (four) hours as needed for severe pain. 02/03/14   Evalee Jefferson, PA-C   BP 129/81  Pulse 70  Temp(Src) 98.2 F (36.8 C) (Oral)  Resp 20  Ht 5\' 5"  (1.651 m)  Wt 184 lb (83.462 kg)  BMI 30.62 kg/m2  SpO2 99%  LMP 01/08/2014 Physical Exam  Constitutional: She is oriented to person, place, and time. She appears well-developed and well-nourished. No distress.  HENT:  Head: Normocephalic and atraumatic.  Right Ear: Tympanic membrane and external ear normal.   Left Ear: Tympanic membrane and external ear normal.  Mouth/Throat: Oropharynx is clear and moist and mucous membranes are normal. No oral lesions. No trismus in the jaw. Dental caries present. No dental abscesses or uvula swelling.  Eyes: Conjunctivae are normal.  Neck: Normal range of motion. Neck supple.  Cardiovascular: Normal rate and normal heart sounds.   Pulmonary/Chest: Effort normal.  Abdominal: She exhibits no distension.  Musculoskeletal: Normal range of motion.  Lymphadenopathy:    She has no cervical adenopathy.  Neurological: She is alert and oriented to person, place, and time. She has normal strength. No cranial nerve deficit or sensory deficit.  Skin: Skin is warm and dry. No erythema.  Psychiatric: She has a normal mood and affect.    ED Course  Procedures (including critical care time) Labs Review Labs Reviewed - No data to display  Imaging Review No results found.   EKG Interpretation None      MDM   Final diagnoses:  Pain, dental    No acute findings on exam.  Pt was prescribed #10 oxycodone in place of her hydrocodone.  Advised call to her dentist in the morning for further tx.  Pt understands plan.  The patient appears reasonably screened and/or stabilized for discharge and I doubt any other medical condition or other [facility] requiring further screening, evaluation, or treatment in the ED at this time prior to discharge.     Evalee Jefferson, PA-C 02/05/14 1308

## 2014-02-06 NOTE — ED Provider Notes (Signed)
Medical screening examination/treatment/procedure(s) were performed by non-physician practitioner and as supervising physician I was immediately available for consultation/collaboration.     Veryl Speak, MD 02/06/14 1500

## 2014-02-08 ENCOUNTER — Emergency Department (HOSPITAL_COMMUNITY)
Admission: EM | Admit: 2014-02-08 | Discharge: 2014-02-08 | Disposition: A | Discharge status: Home / Self Care | Payer: Medicaid - Out of State | Attending: Emergency Medicine | Admitting: Emergency Medicine

## 2014-02-08 ENCOUNTER — Encounter (HOSPITAL_COMMUNITY): Payer: Self-pay | Admitting: Emergency Medicine

## 2014-02-08 DIAGNOSIS — N898 Other specified noninflammatory disorders of vagina: Secondary | ICD-10-CM | POA: Insufficient documentation

## 2014-02-08 DIAGNOSIS — Z791 Long term (current) use of non-steroidal anti-inflammatories (NSAID): Secondary | ICD-10-CM | POA: Insufficient documentation

## 2014-02-08 DIAGNOSIS — Z8639 Personal history of other endocrine, nutritional and metabolic disease: Secondary | ICD-10-CM | POA: Insufficient documentation

## 2014-02-08 DIAGNOSIS — R1030 Lower abdominal pain, unspecified: Secondary | ICD-10-CM

## 2014-02-08 DIAGNOSIS — R109 Unspecified abdominal pain: Secondary | ICD-10-CM | POA: Insufficient documentation

## 2014-02-08 DIAGNOSIS — M545 Low back pain, unspecified: Secondary | ICD-10-CM | POA: Insufficient documentation

## 2014-02-08 DIAGNOSIS — Z9079 Acquired absence of other genital organ(s): Secondary | ICD-10-CM | POA: Insufficient documentation

## 2014-02-08 DIAGNOSIS — G8929 Other chronic pain: Secondary | ICD-10-CM | POA: Insufficient documentation

## 2014-02-08 DIAGNOSIS — Z862 Personal history of diseases of the blood and blood-forming organs and certain disorders involving the immune mechanism: Secondary | ICD-10-CM | POA: Insufficient documentation

## 2014-02-08 DIAGNOSIS — Z792 Long term (current) use of antibiotics: Secondary | ICD-10-CM | POA: Insufficient documentation

## 2014-02-08 DIAGNOSIS — Z3202 Encounter for pregnancy test, result negative: Secondary | ICD-10-CM | POA: Insufficient documentation

## 2014-02-08 DIAGNOSIS — F17200 Nicotine dependence, unspecified, uncomplicated: Secondary | ICD-10-CM | POA: Insufficient documentation

## 2014-02-08 DIAGNOSIS — N939 Abnormal uterine and vaginal bleeding, unspecified: Secondary | ICD-10-CM

## 2014-02-08 DIAGNOSIS — F411 Generalized anxiety disorder: Secondary | ICD-10-CM | POA: Insufficient documentation

## 2014-02-08 DIAGNOSIS — Z79899 Other long term (current) drug therapy: Secondary | ICD-10-CM | POA: Insufficient documentation

## 2014-02-08 LAB — PREGNANCY, URINE: PREG TEST UR: NEGATIVE

## 2014-02-08 LAB — URINALYSIS, ROUTINE W REFLEX MICROSCOPIC
BILIRUBIN URINE: NEGATIVE
Glucose, UA: NEGATIVE mg/dL
Hgb urine dipstick: NEGATIVE
Ketones, ur: NEGATIVE mg/dL
LEUKOCYTES UA: NEGATIVE
NITRITE: NEGATIVE
PH: 6 (ref 5.0–8.0)
Protein, ur: NEGATIVE mg/dL
SPECIFIC GRAVITY, URINE: 1.015 (ref 1.005–1.030)
Urobilinogen, UA: 0.2 mg/dL (ref 0.0–1.0)

## 2014-02-08 MED ORDER — OXYCODONE-ACETAMINOPHEN 5-325 MG PO TABS
1.0000 | ORAL_TABLET | Freq: Once | ORAL | Status: AC
  Administered 2014-02-08: 1 via ORAL
  Filled 2014-02-08: qty 1

## 2014-02-08 MED ORDER — IBUPROFEN 800 MG PO TABS
800.0000 mg | ORAL_TABLET | Freq: Once | ORAL | Status: AC
  Administered 2014-02-08: 800 mg via ORAL
  Filled 2014-02-08: qty 1

## 2014-02-08 MED ORDER — NAPROXEN 500 MG PO TABS: 500.0000 mg | ORAL_TABLET | Freq: Two times a day (BID) | ORAL | Status: DC

## 2014-02-08 NOTE — ED Provider Notes (Signed)
CSN: [id number]     Arrival date & time 02/08/14  0133 History   First MD Initiated Contact with Patient 02/08/14 0135     Chief Complaint  Patient presents with  . Abdominal Pain  . Vaginal Bleeding     HPI Patient has a history of recurrent abdominal pain.  She states her pain is in her low back tender low abdomen.  She denies nausea and vomiting.  No diarrhea.  Abnormal vaginal bleeding over the past 4 days.  She is on Lupron and is being followed at [facility] . She was being given Vicodin by her OB/GYN Dr. Glo Herring but she states that he is no longer seeing her.  She last received a prescription of Vicodin from Dr. Gwenlyn Found on 01/23/2014.  She was also given Percocet in the emergency department for dental pain 2 days ago.  She states she took her last Percocet today and is having ongoing discomfort and pain.  No vaginal discharge.  No dysuria or urinary frequency.  No recent injury or trauma.  Pain is moderate to severe in severity.   Past Medical History  Diagnosis Date  . Ovarian cyst   . Benign tumor of adrenal gland   . Chronic pelvic pain in female   . Chronic back pain   . Anxiety    Past Surgical History  Procedure Laterality Date  . Oophorectomy Right   . Adrenalectomy     Family History  Problem Relation Age of Onset  . Diabetes Maternal Grandmother   . Heart disease Maternal Grandmother   . Diabetes Maternal Grandfather   . Heart disease Maternal Grandfather    History  Substance Use Topics  . Smoking status: Current Every Day Smoker -- 0.50 packs/day    Types: Cigarettes  . Smokeless tobacco: Never Used  . Alcohol Use: No   OB History   Grav Para Term Preterm Abortions TAB SAB Ect Mult Living   0 0             Review of Systems  All other systems reviewed and are negative.     Allergies  Review of patient's allergies indicates no active allergies.  Home Medications   Prior to Admission medications   Medication Sig Start Date End Date Taking?  Authorizing Provider  albuterol (PROVENTIL HFA;VENTOLIN HFA) 108 (90 BASE) MCG/ACT inhaler Inhale 2 puffs into the lungs every 6 (six) hours as needed for wheezing or shortness of breath.   Yes Historical Provider, MD  ascorbic Acid (VITAMIN C) 500 MG CPCR Take 500 mg by mouth daily.   Yes Historical Provider, MD  Calcium Carbonate-Vit D-Min (CALCIUM 1200 PO) Take by mouth.   Yes Historical Provider, MD  ibuprofen (ADVIL,MOTRIN) 800 MG tablet Take 800 mg by mouth every 8 (eight) hours as needed. pain   Yes Historical Provider, MD  ibuprofen (ADVIL,MOTRIN) 800 MG tablet Take 1 tablet (800 mg total) by mouth 3 (three) times daily. 01/18/14  Yes Orpah Greek, MD  oxyCODONE-acetaminophen (PERCOCET/ROXICET) 5-325 MG per tablet Take 1 tablet by mouth every 4 (four) hours as needed for severe pain. 02/03/14  Yes Evalee Jefferson, PA-C  doxycycline (VIBRAMYCIN) 100 MG capsule Take 1 capsule (100 mg total) by mouth 2 (two) times daily. 01/18/14   Orpah Greek, MD  Gabapentin (NEURONTIN PO) Take by mouth.    Historical Provider, MD  HYDROcodone-acetaminophen (NORCO/VICODIN) 5-325 MG per tablet Take 1-2 tablets by mouth every 4 (four) hours as needed for moderate pain. 01/23/14  Jonnie Kind, MD  megestrol (MEGACE) 40 MG tablet Take 40 mg by mouth daily. For breakthrough bleeding    Historical Provider, MD  naproxen (NAPROSYN) 500 MG tablet Take 1 tablet (500 mg total) by mouth 2 (two) times daily. 02/08/14   Hoy Morn, MD   BP 108/71  Pulse 84  Temp(Src) 98.9 F (37.2 C) (Oral)  Resp 22  Ht 5\' 6"  (1.676 m)  Wt 181 lb (82.101 kg)  BMI 29.23 kg/m2  SpO2 100%  LMP 01/08/2014 Physical Exam  Nursing note and vitals reviewed. Constitutional: She is oriented to person, place, and time. She appears well-developed and well-nourished. No distress.  HENT:  Head: Normocephalic and atraumatic.  Eyes: EOM are normal.  Neck: Normal range of motion.  Cardiovascular: Normal rate, regular rhythm  and normal heart sounds.   Pulmonary/Chest: Effort normal and breath sounds normal.  Abdominal: Soft. She exhibits no distension. There is no tenderness.  Musculoskeletal: Normal range of motion.  Mild paralumbar tenderness bilaterally without spasm.  Neurological: She is alert and oriented to person, place, and time.  Skin: Skin is warm and dry.  Psychiatric: She has a normal mood and affect. Judgment normal.    ED Course  Procedures (including critical care time) Labs Review Labs Reviewed  PREGNANCY, URINE  URINALYSIS, ROUTINE W REFLEX MICROSCOPIC    Imaging Review No results found.   EKG Interpretation None      MDM   Final diagnoses:  Low back pain without sciatica, unspecified back pain laterality  Lower abdominal pain  Abnormal vaginal bleeding      Urine pregnancy negative.  Urinalysis without hematuria or signs of infection.  Repeat abdominal exam is benign.  Discharge home in good condition with anti-inflammatories.   Hoy Morn, MD 02/08/14 [phone]

## 2014-02-08 NOTE — ED Notes (Signed)
Pt states she has been bleeding x 3day, passing small clots

## 2014-02-08 NOTE — ED Notes (Signed)
Back pain, radiating around to left side, denies vomiting diarrhea

## 2014-02-08 NOTE — ED Notes (Addendum)
Pt ambulatory, c/o pain and wanting more medication. Stated she understood her discharge papers and took them from nurse.  Was given ibuprofen and percocet prior to discharge per order.

## 2014-02-08 NOTE — Discharge Instructions (Signed)

## 2014-03-19 ENCOUNTER — Emergency Department (HOSPITAL_COMMUNITY)
Admission: EM | Admit: 2014-03-19 | Discharge: 2014-03-19 | Disposition: A | Discharge status: Home / Self Care | Payer: Medicaid - Out of State | Attending: Emergency Medicine | Admitting: Emergency Medicine

## 2014-03-19 ENCOUNTER — Encounter (HOSPITAL_COMMUNITY): Payer: Self-pay | Admitting: Emergency Medicine

## 2014-03-19 DIAGNOSIS — F17200 Nicotine dependence, unspecified, uncomplicated: Secondary | ICD-10-CM | POA: Insufficient documentation

## 2014-03-19 DIAGNOSIS — K089 Disorder of teeth and supporting structures, unspecified: Secondary | ICD-10-CM | POA: Insufficient documentation

## 2014-03-19 DIAGNOSIS — Z79899 Other long term (current) drug therapy: Secondary | ICD-10-CM | POA: Insufficient documentation

## 2014-03-19 DIAGNOSIS — Z8639 Personal history of other endocrine, nutritional and metabolic disease: Secondary | ICD-10-CM | POA: Insufficient documentation

## 2014-03-19 DIAGNOSIS — Z862 Personal history of diseases of the blood and blood-forming organs and certain disorders involving the immune mechanism: Secondary | ICD-10-CM | POA: Insufficient documentation

## 2014-03-19 DIAGNOSIS — G8929 Other chronic pain: Secondary | ICD-10-CM | POA: Insufficient documentation

## 2014-03-19 DIAGNOSIS — F411 Generalized anxiety disorder: Secondary | ICD-10-CM | POA: Insufficient documentation

## 2014-03-19 DIAGNOSIS — Z8742 Personal history of other diseases of the female genital tract: Secondary | ICD-10-CM | POA: Insufficient documentation

## 2014-03-19 DIAGNOSIS — Z791 Long term (current) use of non-steroidal anti-inflammatories (NSAID): Secondary | ICD-10-CM | POA: Insufficient documentation

## 2014-03-19 DIAGNOSIS — K0889 Other specified disorders of teeth and supporting structures: Secondary | ICD-10-CM

## 2014-03-19 MED ORDER — OXYCODONE-ACETAMINOPHEN 5-325 MG PO TABS: 1.0000 | ORAL_TABLET | Freq: Four times a day (QID) | ORAL | Status: DC | PRN

## 2014-03-19 MED ORDER — PENICILLIN V POTASSIUM 500 MG PO TABS: 500.0000 mg | ORAL_TABLET | Freq: Four times a day (QID) | ORAL | Status: AC

## 2014-03-19 NOTE — Discharge Instructions (Signed)
Follow up with a dentist next week. °

## 2014-03-19 NOTE — ED Provider Notes (Signed)
CSN: [id number]     Arrival date & time 03/19/14  0703 History  This chart was scribed for Colleen Diego, MD by Ludger Nutting, ED Scribe. This patient was seen in room APA04/[facility]04 and the patient's care was started 7:41 AM.    Chief Complaint  Patient presents with  . Dental Pain    Patient is a 23 y.o. female presenting with tooth pain. The history is provided by the patient. No language interpreter was used.  Dental Pain Location:  Upper Upper teeth location:  14/LU 1st molar Quality:  Constant Severity:  Moderate Onset quality:  Gradual Duration:  3 days Timing:  Constant Progression:  Worsening Associated symptoms: no fever   Risk factors: smoking    HPI Comments: Colleen Ortiz is a 23 y.o. female who presents to the Emergency Department complaining of a few days of constant upper left dental pain which worsened this morning. Patient states she has been seen by a dentist and understands that she needs at least 2 teeth extracted. She states that she has been unable to follow up due to financial reasons. She denies fever, chills.   Past Medical History  Diagnosis Date  . Colleen Ortiz   . Benign tumor of adrenal gland   . Chronic pelvic pain in female   . Chronic back pain   . Anxiety    Past Surgical History  Procedure Laterality Date  . Oophorectomy Right   . Adrenalectomy     Family History  Problem Relation Age of Onset  . Diabetes Maternal Grandmother   . Heart disease Maternal Grandmother   . Diabetes Maternal Grandfather   . Heart disease Maternal Grandfather    History  Substance Use Topics  . Smoking status: Current Every Day Smoker -- 0.50 packs/day    Types: Cigarettes  . Smokeless tobacco: Never Used  . Alcohol Use: No   OB History   Grav Para Term Preterm Abortions TAB SAB Ect Mult Living   0 0             Review of Systems  Constitutional: Negative for fever and chills.  HENT: Positive for dental problem.       Allergies   Codeine  Home Medications   Prior to Admission medications   Medication Sig Start Date End Date Taking? Authorizing Provider  albuterol (PROVENTIL HFA;VENTOLIN HFA) 108 (90 BASE) MCG/ACT inhaler Inhale 2 puffs into the lungs every 6 (six) hours as needed for wheezing or shortness of breath.    Historical Provider, MD  ascorbic Acid (VITAMIN C) 500 MG CPCR Take 500 mg by mouth daily.    Historical Provider, MD  Calcium Carbonate-Vit D-Min (CALCIUM 1200 PO) Take by mouth.    Historical Provider, MD  doxycycline (VIBRAMYCIN) 100 MG capsule Take 1 capsule (100 mg total) by mouth 2 (two) times daily. 01/18/14   Orpah Greek, MD  Gabapentin (NEURONTIN PO) Take by mouth.    Historical Provider, MD  HYDROcodone-acetaminophen (NORCO/VICODIN) 5-325 MG per tablet Take 1-2 tablets by mouth every 4 (four) hours as needed for moderate pain. 01/23/14   Jonnie Kind, MD  ibuprofen (ADVIL,MOTRIN) 800 MG tablet Take 800 mg by mouth every 8 (eight) hours as needed. pain    Historical Provider, MD  ibuprofen (ADVIL,MOTRIN) 800 MG tablet Take 1 tablet (800 mg total) by mouth 3 (three) times daily. 01/18/14   Orpah Greek, MD  megestrol (MEGACE) 40 MG tablet Take 40 mg by mouth daily. For breakthrough  bleeding    Historical Provider, MD  naproxen (NAPROSYN) 500 MG tablet Take 1 tablet (500 mg total) by mouth 2 (two) times daily. 02/08/14   Hoy Morn, MD  oxyCODONE-acetaminophen (PERCOCET/ROXICET) 5-325 MG per tablet Take 1 tablet by mouth every 4 (four) hours as needed for severe pain. 02/03/14   Evalee Jefferson, PA-C   BP 125/67  Pulse 85  Temp(Src) 98.3 F (36.8 C) (Oral)  Resp 20  Ht 5\' 5"  (1.651 m)  Wt 188 lb (85.276 kg)  BMI 31.28 kg/m2  SpO2 98% Physical Exam  Nursing note and vitals reviewed. Constitutional: She is oriented to person, place, and time. She appears well-developed.  HENT:  Head: Normocephalic.  Left upper molar is tender and decayed   Eyes: Conjunctivae and EOM are  normal.  Neck: Normal range of motion. No tracheal deviation present.  Cardiovascular: Normal rate.   No murmur heard. Pulmonary/Chest: Effort normal.  Musculoskeletal: Normal range of motion.  Neurological: She is oriented to person, place, and time.  Skin: Skin is warm.  Psychiatric: She has a normal mood and affect.    ED Course  Procedures (including critical care time)  DIAGNOSTIC STUDIES: Oxygen Saturation is 98% on RA, normal by my interpretation.    COORDINATION OF CARE: 7:44 AM Discussed treatment plan with pt at bedside and pt agreed to plan.   Labs Review Labs Reviewed - No data to display  Imaging Review No results found.   EKG Interpretation None      MDM   Final diagnoses:  None   The chart was scribed for me under my direct supervision.  I personally performed the history, physical, and medical decision making and all procedures in the evaluation of this patient.Colleen Diego, MD 03/20/14 [phone]

## 2014-03-19 NOTE — ED Notes (Signed)
Pt c/o left side dental pain that started a few days ago,

## 2014-05-09 DIAGNOSIS — G8929 Other chronic pain: Secondary | ICD-10-CM | POA: Insufficient documentation

## 2014-05-09 DIAGNOSIS — Z792 Long term (current) use of antibiotics: Secondary | ICD-10-CM | POA: Insufficient documentation

## 2014-05-09 DIAGNOSIS — Z791 Long term (current) use of non-steroidal anti-inflammatories (NSAID): Secondary | ICD-10-CM | POA: Insufficient documentation

## 2014-05-09 DIAGNOSIS — Z862 Personal history of diseases of the blood and blood-forming organs and certain disorders involving the immune mechanism: Secondary | ICD-10-CM | POA: Insufficient documentation

## 2014-05-09 DIAGNOSIS — F411 Generalized anxiety disorder: Secondary | ICD-10-CM | POA: Diagnosis not present

## 2014-05-09 DIAGNOSIS — J209 Acute bronchitis, unspecified: Secondary | ICD-10-CM | POA: Diagnosis not present

## 2014-05-09 DIAGNOSIS — Z3202 Encounter for pregnancy test, result negative: Secondary | ICD-10-CM | POA: Diagnosis not present

## 2014-05-09 DIAGNOSIS — R110 Nausea: Secondary | ICD-10-CM | POA: Diagnosis present

## 2014-05-09 DIAGNOSIS — R51 Headache: Secondary | ICD-10-CM | POA: Insufficient documentation

## 2014-05-09 DIAGNOSIS — H9209 Otalgia, unspecified ear: Secondary | ICD-10-CM | POA: Diagnosis not present

## 2014-05-09 DIAGNOSIS — Z79899 Other long term (current) drug therapy: Secondary | ICD-10-CM | POA: Diagnosis not present

## 2014-05-09 DIAGNOSIS — Z8742 Personal history of other diseases of the female genital tract: Secondary | ICD-10-CM | POA: Diagnosis not present

## 2014-05-09 DIAGNOSIS — Z8639 Personal history of other endocrine, nutritional and metabolic disease: Secondary | ICD-10-CM | POA: Insufficient documentation

## 2014-05-09 DIAGNOSIS — F17200 Nicotine dependence, unspecified, uncomplicated: Secondary | ICD-10-CM | POA: Diagnosis not present

## 2014-05-09 DIAGNOSIS — E860 Dehydration: Secondary | ICD-10-CM | POA: Diagnosis not present

## 2014-05-10 ENCOUNTER — Encounter (HOSPITAL_COMMUNITY): Payer: Self-pay | Admitting: Emergency Medicine

## 2014-05-10 ENCOUNTER — Emergency Department (HOSPITAL_COMMUNITY)
Admission: EM | Admit: 2014-05-10 | Discharge: 2014-05-10 | Disposition: A | Discharge status: Home / Self Care | Payer: Medicaid - Out of State | Attending: Emergency Medicine | Admitting: Emergency Medicine

## 2014-05-10 DIAGNOSIS — J40 Bronchitis, not specified as acute or chronic: Secondary | ICD-10-CM

## 2014-05-10 LAB — BASIC METABOLIC PANEL
ANION GAP: 10 (ref 5–15)
BUN: 8 mg/dL (ref 6–23)
CO2: 27 meq/L (ref 19–32)
Calcium: 9.1 mg/dL (ref 8.4–10.5)
Chloride: 105 mEq/L (ref 96–112)
Creatinine, Ser: 0.68 mg/dL (ref 0.50–1.10)
GFR calc Af Amer: >90 mL/min (ref >90)
GLUCOSE: 92 mg/dL (ref 70–99)
POTASSIUM: 4 meq/L (ref 3.7–5.3)
SODIUM: 142 meq/L (ref 137–147)

## 2014-05-10 LAB — URINALYSIS, ROUTINE W REFLEX MICROSCOPIC
Bilirubin Urine: NEGATIVE
Glucose, UA: NEGATIVE mg/dL
Hgb urine dipstick: NEGATIVE
Ketones, ur: NEGATIVE mg/dL
Leukocytes, UA: NEGATIVE
NITRITE: NEGATIVE
Protein, ur: NEGATIVE mg/dL
SPECIFIC GRAVITY, URINE: 1.015 (ref 1.005–1.030)
UROBILINOGEN UA: 0.2 mg/dL (ref 0.0–1.0)
pH: 7.5 (ref 5.0–8.0)

## 2014-05-10 LAB — CBC WITH DIFFERENTIAL/PLATELET
Basophils Absolute: 0 10*3/uL (ref 0.0–0.1)
Basophils Relative: 0 % (ref 0–1)
EOS ABS: 0.5 10*3/uL (ref 0.0–0.7)
Eosinophils Relative: 5 % (ref 0–5)
HCT: 38.3 % (ref 36.0–46.0)
Hemoglobin: 13.1 g/dL (ref 12.0–15.0)
LYMPHS PCT: 16 % (ref 12–46)
Lymphs Abs: 1.6 10*3/uL (ref 0.7–4.0)
MCH: 32.4 pg (ref 26.0–34.0)
MCHC: 34.2 g/dL (ref 30.0–36.0)
MCV: 94.8 fL (ref 78.0–100.0)
Monocytes Absolute: 0.8 10*3/uL (ref 0.1–1.0)
Monocytes Relative: 9 % (ref 3–12)
NEUTROS PCT: 70 % (ref 43–77)
Neutro Abs: 6.6 10*3/uL (ref 1.7–7.7)
PLATELETS: 268 10*3/uL (ref 150–400)
RBC: 4.04 MIL/uL (ref 3.87–5.11)
RDW: 13.2 % (ref 11.5–15.5)
WBC: 9.5 10*3/uL (ref 4.0–10.5)

## 2014-05-10 LAB — POC URINE PREG, ED: PREG TEST UR: NEGATIVE

## 2014-05-10 MED ORDER — ONDANSETRON HCL 4 MG/2ML IJ SOLN
4.0000 mg | Freq: Once | INTRAMUSCULAR | Status: AC
  Administered 2014-05-10: 4 mg via INTRAVENOUS
  Filled 2014-05-10: qty 2

## 2014-05-10 MED ORDER — HYDROCODONE-ACETAMINOPHEN 5-325 MG PO TABS: 1.0000 | ORAL_TABLET | Freq: Four times a day (QID) | ORAL | Status: DC | PRN

## 2014-05-10 MED ORDER — SODIUM CHLORIDE 0.9 % IV SOLN
INTRAVENOUS | Status: AC
  Administered 2014-05-10: 01:00:00 via INTRAVENOUS

## 2014-05-10 MED ORDER — HYDROCODONE-ACETAMINOPHEN 5-325 MG PO TABS
1.0000 | ORAL_TABLET | Freq: Once | ORAL | Status: AC
  Administered 2014-05-10: 1 via ORAL
  Filled 2014-05-10: qty 1

## 2014-05-10 MED ORDER — KETOROLAC TROMETHAMINE 30 MG/ML IJ SOLN
30.0000 mg | Freq: Once | INTRAMUSCULAR | Status: AC
  Administered 2014-05-10: 30 mg via INTRAVENOUS
  Filled 2014-05-10: qty 1

## 2014-05-10 MED ORDER — AMOXICILLIN 500 MG PO CAPS: 500.0000 mg | ORAL_CAPSULE | Freq: Three times a day (TID) | ORAL | Status: DC

## 2014-05-10 NOTE — ED Provider Notes (Signed)
CSN: [id number]     Arrival date & time 05/09/14  2358 History   First MD Initiated Contact with Patient 05/10/14 0012     Chief Complaint  Patient presents with  . Nausea  . Emesis  . URI     (Consider location/radiation/quality/duration/timing/severity/associated sxs/prior Treatment) The history is provided by the patient.   Colleen Ortiz is a 23 y.o. female who presents to the ED with nausea. The nausea started earlier today. She has not vomited but can't eat due to the nausea. Complains of feeling dehydrated. She also has had nasal congestion for the past 2 days. She has a productive cough. She denies fever but has had chills and aching all over. Patient states she was exposed to her cousin who had the flu. Patient has taken several OTC medications without relief.   Past Medical History  Diagnosis Date  . Ovarian cyst   . Benign tumor of adrenal gland   . Chronic pelvic pain in female   . Chronic back pain   . Anxiety    Past Surgical History  Procedure Laterality Date  . Oophorectomy Right   . Adrenalectomy     Family History  Problem Relation Age of Onset  . Diabetes Maternal Grandmother   . Heart disease Maternal Grandmother   . Diabetes Maternal Grandfather   . Heart disease Maternal Grandfather    History  Substance Use Topics  . Smoking status: Current Every Day Smoker -- 0.50 packs/day    Types: Cigarettes  . Smokeless tobacco: Never Used  . Alcohol Use: No   OB History   Grav Para Term Preterm Abortions TAB SAB Ect Mult Living   0 0             Review of Systems  Constitutional: Positive for chills. Negative for fever.  HENT: Positive for congestion, ear pain and sinus pressure. Negative for sore throat and trouble swallowing.   Eyes: Negative for visual disturbance. Eye discharge: watery.  Respiratory: Positive for cough and chest tightness.   Cardiovascular: Negative for chest pain and palpitations.  Gastrointestinal: Positive for nausea.  Negative for vomiting and abdominal pain.  Genitourinary: Negative for dysuria, urgency and frequency.  Musculoskeletal: Positive for myalgias. Back pain: chronic.  Skin: Negative for rash.  Neurological: Positive for headaches (over sinuses). Negative for syncope and light-headedness.  Psychiatric/Behavioral: Negative for confusion. The patient is not nervous/anxious.       Allergies  Codeine  Home Medications   Prior to Admission medications   Medication Sig Start Date End Date Taking? Authorizing Provider  albuterol (PROVENTIL HFA;VENTOLIN HFA) 108 (90 BASE) MCG/ACT inhaler Inhale 2 puffs into the lungs every 6 (six) hours as needed for wheezing or shortness of breath.    Historical Provider, MD  ascorbic Acid (VITAMIN C) 500 MG CPCR Take 500 mg by mouth daily.    Historical Provider, MD  Calcium Carbonate-Vit D-Min (CALCIUM 1200 PO) Take by mouth.    Historical Provider, MD  doxycycline (VIBRAMYCIN) 100 MG capsule Take 1 capsule (100 mg total) by mouth 2 (two) times daily. 01/18/14   Orpah Greek, MD  Gabapentin (NEURONTIN PO) Take by mouth.    Historical Provider, MD  HYDROcodone-acetaminophen (NORCO/VICODIN) 5-325 MG per tablet Take 1-2 tablets by mouth every 4 (four) hours as needed for moderate pain. 01/23/14   Jonnie Kind, MD  ibuprofen (ADVIL,MOTRIN) 800 MG tablet Take 800 mg by mouth every 8 (eight) hours as needed. pain    Historical Provider,  MD  ibuprofen (ADVIL,MOTRIN) 800 MG tablet Take 1 tablet (800 mg total) by mouth 3 (three) times daily. 01/18/14   Orpah Greek, MD  megestrol (MEGACE) 40 MG tablet Take 40 mg by mouth daily. For breakthrough bleeding    Historical Provider, MD  naproxen (NAPROSYN) 500 MG tablet Take 1 tablet (500 mg total) by mouth 2 (two) times daily. 02/08/14   Hoy Morn, MD  oxyCODONE-acetaminophen (PERCOCET/ROXICET) 5-325 MG per tablet Take 1 tablet by mouth every 4 (four) hours as needed for severe pain. 02/03/14   Evalee Jefferson,  PA-C  oxyCODONE-acetaminophen (PERCOCET/ROXICET) 5-325 MG per tablet Take 1 tablet by mouth every 6 (six) hours as needed. 03/19/14   Maudry Diego, MD   BP 114/69  Pulse 81  Temp(Src) 99.3 F (37.4 C) (Oral)  Resp 20  Ht 5\' 5"  (1.651 m)  Wt 185 lb (83.915 kg)  BMI 30.79 kg/m2  SpO2 99% Physical Exam  Nursing note and vitals reviewed. Constitutional: She is oriented to person, place, and time. She appears well-developed and well-nourished.  HENT:  Head: Normocephalic.  Right Ear: Tympanic membrane is retracted.  Left Ear: Tympanic membrane is retracted.  Mouth/Throat: Uvula is midline and mucous membranes are normal. Posterior oropharyngeal erythema present.  Eyes: Conjunctivae and EOM are normal.  Neck: Normal range of motion. Neck supple.  Cardiovascular: Normal rate and regular rhythm.   Pulmonary/Chest: Effort normal. She has no wheezes. She has no rales.  Abdominal: Soft. Bowel sounds are normal. There is no tenderness.  Musculoskeletal: Normal range of motion.  Lymphadenopathy:    She has no cervical adenopathy.  Neurological: She is alert and oriented to person, place, and time. No cranial nerve deficit.  Skin: Skin is warm and dry.  Psychiatric: She has a normal mood and affect. Her behavior is normal.    ED Course  Procedures ( Results for orders placed during the hospital encounter of 05/10/14 (from the past 24 hour(s))  CBC WITH DIFFERENTIAL     Status: None   Collection Time    05/10/14 12:27 AM      Result Value Ref Range   WBC 9.5  4.0 - 10.5 K/uL   RBC 4.04  3.87 - 5.11 MIL/uL   Hemoglobin 13.1  12.0 - 15.0 g/dL   HCT 38.3  36.0 - 46.0 %   MCV 94.8  78.0 - 100.0 fL   MCH 32.4  26.0 - 34.0 pg   MCHC 34.2  30.0 - 36.0 g/dL   RDW 13.2  11.5 - 15.5 %   Platelets 268  150 - 400 K/uL   Neutrophils Relative % 70  43 - 77 %   Neutro Abs 6.6  1.7 - 7.7 K/uL   Lymphocytes Relative 16  12 - 46 %   Lymphs Abs 1.6  0.7 - 4.0 K/uL   Monocytes Relative 9  3 - 12  %   Monocytes Absolute 0.8  0.1 - 1.0 K/uL   Eosinophils Relative 5  0 - 5 %   Eosinophils Absolute 0.5  0.0 - 0.7 K/uL   Basophils Relative 0  0 - 1 %   Basophils Absolute 0.0  0.0 - 0.1 K/uL  POC URINE PREG, ED     Status: None   Collection Time    05/10/14  1:07 AM      Result Value Ref Range   Preg Test, Ur NEGATIVE  NEGATIVE    MDM  22 y.o. female with nausea, cough, congestion  and myalgias for the past 2 days. Patient with BMET and urine pending. Care turned over to Dr. Roderic Palau.     [place], [place] 05/10/14 [phone]

## 2014-05-10 NOTE — ED Provider Notes (Signed)
Medical screening examination/treatment/procedure(s) were performed by non-physician practitioner and as supervising physician I was immediately available for consultation/collaboration.   EKG Interpretation None        Maudry Diego, MD 05/10/14 [phone]

## 2014-05-10 NOTE — ED Notes (Signed)
Pt requesting something for aching, Hope NP informed and new orders given and carried out. Pt's friend given warm blanket.

## 2014-05-10 NOTE — Discharge Instructions (Signed)
Tylenol for fever.  Follow up next week if not improving

## 2014-05-10 NOTE — ED Notes (Signed)
Pt requesting something else for pain, EDP made aware

## 2014-05-13 ENCOUNTER — Emergency Department (HOSPITAL_COMMUNITY)
Admission: EM | Admit: 2014-05-13 | Discharge: 2014-05-14 | Disposition: A | Discharge status: Home / Self Care | Payer: Medicaid - Out of State | Attending: Emergency Medicine | Admitting: Emergency Medicine

## 2014-05-13 DIAGNOSIS — Z792 Long term (current) use of antibiotics: Secondary | ICD-10-CM | POA: Insufficient documentation

## 2014-05-13 DIAGNOSIS — R5381 Other malaise: Secondary | ICD-10-CM | POA: Insufficient documentation

## 2014-05-13 DIAGNOSIS — G8929 Other chronic pain: Secondary | ICD-10-CM | POA: Diagnosis not present

## 2014-05-13 DIAGNOSIS — R110 Nausea: Secondary | ICD-10-CM | POA: Diagnosis not present

## 2014-05-13 DIAGNOSIS — J40 Bronchitis, not specified as acute or chronic: Secondary | ICD-10-CM | POA: Diagnosis not present

## 2014-05-13 DIAGNOSIS — Z79899 Other long term (current) drug therapy: Secondary | ICD-10-CM | POA: Insufficient documentation

## 2014-05-13 DIAGNOSIS — Z8639 Personal history of other endocrine, nutritional and metabolic disease: Secondary | ICD-10-CM | POA: Diagnosis not present

## 2014-05-13 DIAGNOSIS — Z791 Long term (current) use of non-steroidal anti-inflammatories (NSAID): Secondary | ICD-10-CM | POA: Diagnosis not present

## 2014-05-13 DIAGNOSIS — R05 Cough: Secondary | ICD-10-CM | POA: Diagnosis present

## 2014-05-13 DIAGNOSIS — Z8659 Personal history of other mental and behavioral disorders: Secondary | ICD-10-CM | POA: Insufficient documentation

## 2014-05-13 DIAGNOSIS — Z8742 Personal history of other diseases of the female genital tract: Secondary | ICD-10-CM | POA: Diagnosis not present

## 2014-05-13 DIAGNOSIS — F17200 Nicotine dependence, unspecified, uncomplicated: Secondary | ICD-10-CM | POA: Insufficient documentation

## 2014-05-13 DIAGNOSIS — Z862 Personal history of diseases of the blood and blood-forming organs and certain disorders involving the immune mechanism: Secondary | ICD-10-CM | POA: Diagnosis not present

## 2014-05-13 DIAGNOSIS — R059 Cough, unspecified: Secondary | ICD-10-CM | POA: Diagnosis present

## 2014-05-13 DIAGNOSIS — R5383 Other fatigue: Secondary | ICD-10-CM | POA: Insufficient documentation

## 2014-05-13 HISTORY — DX: Bronchitis, not specified as acute or chronic: J40

## 2014-05-14 ENCOUNTER — Emergency Department (HOSPITAL_COMMUNITY): Payer: Medicaid - Out of State

## 2014-05-14 ENCOUNTER — Encounter (HOSPITAL_COMMUNITY): Payer: Self-pay | Admitting: Emergency Medicine

## 2014-05-14 MED ORDER — BENZONATATE 100 MG PO CAPS: 100.0000 mg | ORAL_CAPSULE | Freq: Three times a day (TID) | ORAL | Status: DC | PRN

## 2014-05-14 MED ORDER — ALBUTEROL SULFATE (2.5 MG/3ML) 0.083% IN NEBU
2.5000 mg | INHALATION_SOLUTION | Freq: Once | RESPIRATORY_TRACT | Status: AC
  Administered 2014-05-14: 2.5 mg via RESPIRATORY_TRACT
  Filled 2014-05-14: qty 3

## 2014-05-14 MED ORDER — ALBUTEROL SULFATE HFA 108 (90 BASE) MCG/ACT IN AERS
2.0000 | INHALATION_SPRAY | RESPIRATORY_TRACT | Status: DC | PRN
  Administered 2014-05-14: 2 via RESPIRATORY_TRACT
  Filled 2014-05-14: qty 6.7

## 2014-05-14 MED ORDER — PREDNISONE 50 MG PO TABS
60.0000 mg | ORAL_TABLET | Freq: Once | ORAL | Status: AC
  Administered 2014-05-14: 60 mg via ORAL
  Filled 2014-05-14 (×2): qty 1

## 2014-05-14 MED ORDER — PREDNISONE 20 MG PO TABS: 60.0000 mg | ORAL_TABLET | Freq: Every day | ORAL | Status: DC

## 2014-05-14 MED ORDER — IPRATROPIUM-ALBUTEROL 0.5-2.5 (3) MG/3ML IN SOLN
3.0000 mL | Freq: Once | RESPIRATORY_TRACT | Status: AC
  Administered 2014-05-14: 3 mL via RESPIRATORY_TRACT
  Filled 2014-05-14: qty 3

## 2014-05-14 MED ORDER — HYDROCOD POLST-CHLORPHEN POLST 10-8 MG/5ML PO LQCR
5.0000 mL | Freq: Once | ORAL | Status: AC
  Administered 2014-05-14: 5 mL via ORAL
  Filled 2014-05-14: qty 5

## 2014-05-14 MED ORDER — ALBUTEROL SULFATE HFA 108 (90 BASE) MCG/ACT IN AERS: 2.0000 | INHALATION_SPRAY | RESPIRATORY_TRACT | Status: DC | PRN

## 2014-05-14 NOTE — ED Provider Notes (Signed)
CSN: [id number]     Arrival date & time 05/13/14  2349 History   First MD Initiated Contact with Patient 05/13/14 2357     Chief Complaint  Patient presents with  . Cough     (Consider location/radiation/quality/duration/timing/severity/associated sxs/prior Treatment) The history is provided by the patient.   Colleen Ortiz is a 23 y.o. female who is a smoker, without a history of asthma but has had bronchitis induced bronchospasm frequently, presenting with a 4 day history of cough productive of green and blood tinged sputum, congestion, wheezing, post tussive emesis, fatigue, nausea, myalgia.She reports chest pressure and burning pain worsened by coughing.  She was seen here 2 days ago and diagnosed with bronchitis, now currently on day 3 of amoxicillin. She is also taking an otc decongestant and albuterol mdi which is not relieving her symptoms.  She denies fever, abdominal pain, peripheral edema and orthopnea. She has found no alleviators.   Past Medical History  Diagnosis Date  . Ovarian cyst   . Benign tumor of adrenal gland   . Chronic pelvic pain in female   . Chronic back pain   . Anxiety   . Bronchitis    Past Surgical History  Procedure Laterality Date  . Oophorectomy Right   . Adrenalectomy     Family History  Problem Relation Age of Onset  . Diabetes Maternal Grandmother   . Heart disease Maternal Grandmother   . Diabetes Maternal Grandfather   . Heart disease Maternal Grandfather    History  Substance Use Topics  . Smoking status: Current Every Day Smoker -- 0.50 packs/day    Types: Cigarettes  . Smokeless tobacco: Never Used  . Alcohol Use: No   OB History   Grav Para Term Preterm Abortions TAB SAB Ect Mult Living   0 0             Review of Systems  Constitutional: Positive for fatigue. Negative for fever and chills.  HENT: Positive for congestion. Negative for ear pain, rhinorrhea, sinus pressure, sore throat, trouble swallowing and voice change.    Eyes: Negative for discharge.  Respiratory: Positive for cough, shortness of breath and wheezing. Negative for stridor.   Cardiovascular: Positive for chest pain.  Gastrointestinal: Positive for nausea. Negative for abdominal pain.  Genitourinary: Negative.   Musculoskeletal: Negative for neck stiffness.  Skin: Negative for rash.  Neurological: Positive for weakness.      Allergies  Codeine  Home Medications   Prior to Admission medications   Medication Sig Start Date End Date Taking? Authorizing Provider  albuterol (PROVENTIL HFA;VENTOLIN HFA) 108 (90 BASE) MCG/ACT inhaler Inhale 2 puffs into the lungs every 6 (six) hours as needed for wheezing or shortness of breath.   Yes Historical Provider, MD  amoxicillin (AMOXIL) 500 MG capsule Take 1 capsule (500 mg total) by mouth 3 (three) times daily. 05/10/14  Yes Maudry Diego, MD  ascorbic Acid (VITAMIN C) 500 MG CPCR Take 500 mg by mouth daily.   Yes Historical Provider, MD  Calcium Carbonate-Vit D-Min (CALCIUM 1200 PO) Take by mouth.   Yes Historical Provider, MD  Gabapentin (NEURONTIN PO) Take by mouth.   Yes Historical Provider, MD  HYDROcodone-acetaminophen (NORCO/VICODIN) 5-325 MG per tablet Take 1-2 tablets by mouth every 4 (four) hours as needed for moderate pain. 01/23/14  Yes Jonnie Kind, MD  ibuprofen (ADVIL,MOTRIN) 800 MG tablet Take 800 mg by mouth every 8 (eight) hours as needed. pain   Yes Historical Provider, MD  ibuprofen (ADVIL,MOTRIN) 800 MG tablet Take 1 tablet (800 mg total) by mouth 3 (three) times daily. 01/18/14  Yes Orpah Greek, MD  naproxen (NAPROSYN) 500 MG tablet Take 1 tablet (500 mg total) by mouth 2 (two) times daily. 02/08/14  Yes Hoy Morn, MD  doxycycline (VIBRAMYCIN) 100 MG capsule Take 1 capsule (100 mg total) by mouth 2 (two) times daily. 01/18/14   Orpah Greek, MD  HYDROcodone-acetaminophen (NORCO/VICODIN) 5-325 MG per tablet Take 1 tablet by mouth every 6 (six) hours as  needed. 05/10/14   Maudry Diego, MD  megestrol (MEGACE) 40 MG tablet Take 40 mg by mouth daily. For breakthrough bleeding    Historical Provider, MD  oxyCODONE-acetaminophen (PERCOCET/ROXICET) 5-325 MG per tablet Take 1 tablet by mouth every 4 (four) hours as needed for severe pain. 02/03/14   Evalee Jefferson, PA-C  oxyCODONE-acetaminophen (PERCOCET/ROXICET) 5-325 MG per tablet Take 1 tablet by mouth every 6 (six) hours as needed. 03/19/14   Maudry Diego, MD   BP 127/84  Pulse 72  Temp(Src) 98.9 F (37.2 C) (Oral)  Resp 20  SpO2 90% Physical Exam  Nursing note and vitals reviewed. Constitutional: She appears well-developed and well-nourished.  HENT:  Head: Normocephalic and atraumatic.  Eyes: Conjunctivae are normal.  Neck: Neck supple.  Cardiovascular: Normal rate, regular rhythm, normal heart sounds and intact distal pulses.   Pulmonary/Chest: Effort normal. She has decreased breath sounds. She has wheezes. She has no rhonchi. She exhibits tenderness.  ttp upper chest wall. Decreased breath sounds throughout. Inspiratory and expiratory wheezing. Persistent wet cough.  Abdominal: Soft. Bowel sounds are normal. There is no tenderness. There is no rebound.  Musculoskeletal: Normal range of motion. She exhibits no edema.  Neurological: She is alert.  Skin: Skin is warm and dry.  Psychiatric: She has a normal mood and affect.    ED Course  Procedures (including critical care time) Labs Review Labs Reviewed - No data to display  Imaging Review No results found.   EKG Interpretation None      MDM   Final diagnoses:  None    Pt was given albuterol 5/atroven 0.5 neb, prednisone 60 mg and tussionex , no longer wheezing, but with worsened decreased breath sounds throughout.  Pulse ox 92%.  Will repeat duoneb.  Pending cxr.  Signed out to Dr Dina Rich who will follow patient.    Evalee Jefferson, PA-C 05/14/14 0105

## 2014-05-14 NOTE — ED Provider Notes (Signed)
Medical screening examination/treatment/procedure(s) were conducted as a shared visit with non-physician practitioner(s) and myself.  I personally evaluated the patient during the encounter.   EKG Interpretation None      See additional note for details.  Merryl Hacker, MD 05/14/14 [phone]

## 2014-05-14 NOTE — ED Notes (Signed)
Pt diagnosed with bronchitis on the 18th, started on amoxicillin for same, states cough has gotten worse

## 2014-05-14 NOTE — ED Provider Notes (Signed)
Medical screening examination/treatment/procedure(s) were conducted as a shared visit with non-physician practitioner(s) and myself.  I personally evaluated the patient during the encounter.   EKG Interpretation None      Patient presents with wheezing and cough. She's currently. Currently on amoxicillin and was prescribed an inhaler at home but "doesn't know where it is." Chest x-ray without evidence of pneumonia. Patient coughing on exam with scant expiratory wheezing. Patient able to ambulate with pulse ox. Will add prednisone to patient's regimen for acute bronchitis. Encouraged patient to stop smoking.  Merryl Hacker, MD 05/14/14 [phone]

## 2014-05-14 NOTE — Discharge Instructions (Signed)

## 2014-05-19 ENCOUNTER — Emergency Department (HOSPITAL_COMMUNITY)
Admission: EM | Admit: 2014-05-19 | Discharge: 2014-05-19 | Disposition: A | Discharge status: Home / Self Care | Payer: Medicaid - Out of State | Attending: Emergency Medicine | Admitting: Emergency Medicine

## 2014-05-19 ENCOUNTER — Encounter (HOSPITAL_COMMUNITY): Payer: Self-pay | Admitting: Emergency Medicine

## 2014-05-19 DIAGNOSIS — J40 Bronchitis, not specified as acute or chronic: Secondary | ICD-10-CM | POA: Diagnosis not present

## 2014-05-19 DIAGNOSIS — Z8742 Personal history of other diseases of the female genital tract: Secondary | ICD-10-CM | POA: Insufficient documentation

## 2014-05-19 DIAGNOSIS — F17200 Nicotine dependence, unspecified, uncomplicated: Secondary | ICD-10-CM | POA: Diagnosis not present

## 2014-05-19 DIAGNOSIS — G8929 Other chronic pain: Secondary | ICD-10-CM | POA: Diagnosis not present

## 2014-05-19 DIAGNOSIS — Z792 Long term (current) use of antibiotics: Secondary | ICD-10-CM | POA: Insufficient documentation

## 2014-05-19 DIAGNOSIS — M2550 Pain in unspecified joint: Secondary | ICD-10-CM | POA: Insufficient documentation

## 2014-05-19 DIAGNOSIS — Z791 Long term (current) use of non-steroidal anti-inflammatories (NSAID): Secondary | ICD-10-CM | POA: Insufficient documentation

## 2014-05-19 DIAGNOSIS — Z79899 Other long term (current) drug therapy: Secondary | ICD-10-CM | POA: Insufficient documentation

## 2014-05-19 DIAGNOSIS — Z862 Personal history of diseases of the blood and blood-forming organs and certain disorders involving the immune mechanism: Secondary | ICD-10-CM | POA: Diagnosis not present

## 2014-05-19 DIAGNOSIS — Z3202 Encounter for pregnancy test, result negative: Secondary | ICD-10-CM | POA: Insufficient documentation

## 2014-05-19 DIAGNOSIS — R52 Pain, unspecified: Secondary | ICD-10-CM | POA: Insufficient documentation

## 2014-05-19 DIAGNOSIS — J3489 Other specified disorders of nose and nasal sinuses: Secondary | ICD-10-CM | POA: Insufficient documentation

## 2014-05-19 DIAGNOSIS — IMO0002 Reserved for concepts with insufficient information to code with codable children: Secondary | ICD-10-CM | POA: Insufficient documentation

## 2014-05-19 DIAGNOSIS — R209 Unspecified disturbances of skin sensation: Secondary | ICD-10-CM | POA: Diagnosis not present

## 2014-05-19 DIAGNOSIS — F411 Generalized anxiety disorder: Secondary | ICD-10-CM | POA: Insufficient documentation

## 2014-05-19 DIAGNOSIS — R110 Nausea: Secondary | ICD-10-CM | POA: Diagnosis not present

## 2014-05-19 DIAGNOSIS — R232 Flushing: Secondary | ICD-10-CM

## 2014-05-19 DIAGNOSIS — R630 Anorexia: Secondary | ICD-10-CM | POA: Insufficient documentation

## 2014-05-19 DIAGNOSIS — Z8639 Personal history of other endocrine, nutritional and metabolic disease: Secondary | ICD-10-CM | POA: Insufficient documentation

## 2014-05-19 LAB — URINALYSIS, ROUTINE W REFLEX MICROSCOPIC
BILIRUBIN URINE: NEGATIVE
GLUCOSE, UA: NEGATIVE mg/dL
HGB URINE DIPSTICK: NEGATIVE
KETONES UR: NEGATIVE mg/dL
LEUKOCYTES UA: NEGATIVE
Nitrite: NEGATIVE
PH: 6 (ref 5.0–8.0)
PROTEIN: NEGATIVE mg/dL
Specific Gravity, Urine: 1.015 (ref 1.005–1.030)
Urobilinogen, UA: 0.2 mg/dL (ref 0.0–1.0)

## 2014-05-19 LAB — PREGNANCY, URINE: Preg Test, Ur: NEGATIVE

## 2014-05-19 MED ORDER — ONDANSETRON 4 MG PO TBDP: ORAL_TABLET | ORAL | Status: DC

## 2014-05-19 NOTE — ED Provider Notes (Signed)
CSN: [id number]     Arrival date & time 05/19/14  0301 History   First MD Initiated Contact with Patient 05/19/14 0320     Chief Complaint  Patient presents with  . Generalized Body Aches     (Consider location/radiation/quality/duration/timing/severity/associated sxs/prior Treatment) HPI Comments: 23 year old female with history of abdominal noncancerous mass removed in the past, ovarian cyst, chronic back pain, endometriosis, bronchitis presents with multiple different symptoms. Patient feels that her skin feels hot in her "blood is boiling". Patient has intermittent mild nausea. No significant weight loss or palpitations. No known thyroid or pituitary issues. Patient is not of a focal headache feels hot burning sensation in her head. The symptoms have worsened since she took prednisone for recent bronchitis. Patient no longer taking prednisone normal oxacillin. Patient had blood work and evaluation in one week prior. Patient recently had another estrogen shot for endometriosis and pelvic pain. Patient feels like her symptoms may be related to menopause.  The history is provided by the patient.    Past Medical History  Diagnosis Date  . Ovarian cyst   . Benign tumor of adrenal gland   . Chronic pelvic pain in female   . Chronic back pain   . Anxiety   . Bronchitis    Past Surgical History  Procedure Laterality Date  . Oophorectomy Right   . Adrenalectomy     Family History  Problem Relation Age of Onset  . Diabetes Maternal Grandmother   . Heart disease Maternal Grandmother   . Diabetes Maternal Grandfather   . Heart disease Maternal Grandfather    History  Substance Use Topics  . Smoking status: Current Every Day Smoker -- 0.50 packs/day    Types: Cigarettes  . Smokeless tobacco: Never Used  . Alcohol Use: No   OB History   Grav Para Term Preterm Abortions TAB SAB Ect Mult Living   0 0             Review of Systems  Constitutional: Positive for appetite change.  Negative for fever and chills.  HENT: Positive for congestion.   Eyes: Negative for visual disturbance.  Respiratory: Positive for cough. Negative for shortness of breath.   Cardiovascular: Negative for chest pain.  Gastrointestinal: Positive for nausea. Negative for vomiting and abdominal pain.  Genitourinary: Negative for dysuria and flank pain.  Musculoskeletal: Positive for arthralgias. Negative for back pain, neck pain and neck stiffness.  Skin: Negative for rash.  Neurological: Negative for weakness, light-headedness and headaches.      Allergies  Codeine  Home Medications   Prior to Admission medications   Medication Sig Start Date End Date Taking? Authorizing Provider  albuterol (PROVENTIL HFA;VENTOLIN HFA) 108 (90 BASE) MCG/ACT inhaler Inhale 2 puffs into the lungs every 6 (six) hours as needed for wheezing or shortness of breath.    Historical Provider, MD  albuterol (PROVENTIL HFA;VENTOLIN HFA) 108 (90 BASE) MCG/ACT inhaler Inhale 2 puffs into the lungs every 4 (four) hours as needed for wheezing or shortness of breath. 05/14/14   Merryl Hacker, MD  amoxicillin (AMOXIL) 500 MG capsule Take 1 capsule (500 mg total) by mouth 3 (three) times daily. 05/10/14   Maudry Diego, MD  ascorbic Acid (VITAMIN C) 500 MG CPCR Take 500 mg by mouth daily.    Historical Provider, MD  benzonatate (TESSALON) 100 MG capsule Take 1 capsule (100 mg total) by mouth 3 (three) times daily as needed for cough. 05/14/14   Merryl Hacker, MD  Calcium Carbonate-Vit D-Min (CALCIUM 1200 PO) Take by mouth.    Historical Provider, MD  doxycycline (VIBRAMYCIN) 100 MG capsule Take 1 capsule (100 mg total) by mouth 2 (two) times daily. 01/18/14   Orpah Greek, MD  Gabapentin (NEURONTIN PO) Take by mouth.    Historical Provider, MD  HYDROcodone-acetaminophen (NORCO/VICODIN) 5-325 MG per tablet Take 1-2 tablets by mouth every 4 (four) hours as needed for moderate pain. 01/23/14   Jonnie Kind, MD   HYDROcodone-acetaminophen (NORCO/VICODIN) 5-325 MG per tablet Take 1 tablet by mouth every 6 (six) hours as needed. 05/10/14   Maudry Diego, MD  ibuprofen (ADVIL,MOTRIN) 800 MG tablet Take 800 mg by mouth every 8 (eight) hours as needed. pain    Historical Provider, MD  ibuprofen (ADVIL,MOTRIN) 800 MG tablet Take 1 tablet (800 mg total) by mouth 3 (three) times daily. 01/18/14   Orpah Greek, MD  megestrol (MEGACE) 40 MG tablet Take 40 mg by mouth daily. For breakthrough bleeding    Historical Provider, MD  naproxen (NAPROSYN) 500 MG tablet Take 1 tablet (500 mg total) by mouth 2 (two) times daily. 02/08/14   Hoy Morn, MD  oxyCODONE-acetaminophen (PERCOCET/ROXICET) 5-325 MG per tablet Take 1 tablet by mouth every 4 (four) hours as needed for severe pain. 02/03/14   Evalee Jefferson, PA-C  oxyCODONE-acetaminophen (PERCOCET/ROXICET) 5-325 MG per tablet Take 1 tablet by mouth every 6 (six) hours as needed. 03/19/14   Maudry Diego, MD  predniSONE (DELTASONE) 20 MG tablet Take 3 tablets (60 mg total) by mouth daily with breakfast. 05/14/14   Merryl Hacker, MD   BP 134/89  Pulse 93  Temp(Src) 98.1 F (36.7 C)  Resp 20  SpO2 100% Physical Exam  Nursing note and vitals reviewed. Constitutional: She is oriented to person, place, and time. She appears well-developed and well-nourished.  HENT:  Head: Normocephalic and atraumatic.  Eyes: Conjunctivae are normal. Right eye exhibits no discharge. Left eye exhibits no discharge.  Neck: Normal range of motion. Neck supple. No tracheal deviation present.  Cardiovascular: Normal rate and regular rhythm.   No murmur heard. Pulmonary/Chest: Effort normal and breath sounds normal.  Abdominal: Soft. She exhibits no distension. There is no tenderness. There is no guarding.  Musculoskeletal: She exhibits no edema.  Neurological: She is alert and oriented to person, place, and time. GCS eye subscore is 4. GCS verbal subscore is 5. GCS motor  subscore is 6.  5+ strength in UE and LE with f/e at major joints. Sensation to palpation intact in UE and LE. CNs 2-12 grossly intact.  EOMFI.  PERRL.   Finger nose and coordination intact bilateral.   Visual fields intact to finger testing.   Skin: Skin is warm. No rash noted.  Psychiatric: She has a normal mood and affect.    ED Course  Procedures (including critical care time) Labs Review Labs Reviewed  URINALYSIS, ROUTINE W REFLEX MICROSCOPIC  PREGNANCY, URINE     Imaging Review No results found.   EKG Interpretation None      MDM   Final diagnoses:  Hot flashes  Bronchitis   Patient presented with multiple vague symptoms including taste changes with eating, nausea, cough, body heat sensation. Physical exam unremarkable, patient well-appearing, vitals unremarkable. Reviewed recent blood work that was done which is unremarkable. Long discussion regarding importance of close outpatient followup and to followup with OB/GYN as differentials very broad at this time patient may require endocrinology type workup. Her symptoms also  may be side effect of her recent injection recent medications. Reasons to return given in ER.  Results and differential diagnosis were discussed with the patient/parent/guardian. Close follow up outpatient was discussed, comfortable with the plan.   Medications - No data to display  Filed Vitals:   05/19/14 0315  BP: 134/89  Pulse: 93  Temp: 98.1 F (36.7 C)  Resp: 20  SpO2: 100%    Final diagnoses:  Hot flashes  Bronchitis       Mariea Clonts, MD 05/19/14 0403

## 2014-05-19 NOTE — Discharge Instructions (Signed)
If you were given medicines take as directed.  If you are on coumadin or contraceptives realize their levels and effectiveness is altered by many different medicines.  If you have any reaction (rash, tongues swelling, other) to the medicines stop taking and see a physician.   Please follow up as directed and return to the ER or see a physician for new or worsening symptoms.  Thank you. Filed Vitals:   05/19/14 0315  BP: 134/89  Pulse: 93  Temp: 98.1 F (36.7 C)  Resp: 20  SpO2: 100%

## 2014-05-19 NOTE — ED Notes (Signed)
Pt c/o pain to her head; pt states she doesn't feel right and states she can not explain how she feels; pt states it feels like my blood is boiling; pt c/o nausea

## 2014-06-08 ENCOUNTER — Encounter (HOSPITAL_COMMUNITY): Payer: Self-pay | Admitting: Emergency Medicine

## 2014-06-08 ENCOUNTER — Emergency Department (HOSPITAL_COMMUNITY)
Admission: EM | Admit: 2014-06-08 | Discharge: 2014-06-08 | Disposition: A | Discharge status: Home / Self Care | Payer: Medicaid - Out of State | Attending: Emergency Medicine | Admitting: Emergency Medicine

## 2014-06-08 DIAGNOSIS — Z8709 Personal history of other diseases of the respiratory system: Secondary | ICD-10-CM | POA: Insufficient documentation

## 2014-06-08 DIAGNOSIS — Z792 Long term (current) use of antibiotics: Secondary | ICD-10-CM | POA: Insufficient documentation

## 2014-06-08 DIAGNOSIS — Z86018 Personal history of other benign neoplasm: Secondary | ICD-10-CM | POA: Insufficient documentation

## 2014-06-08 DIAGNOSIS — Z720 Tobacco use: Secondary | ICD-10-CM | POA: Insufficient documentation

## 2014-06-08 DIAGNOSIS — Z8742 Personal history of other diseases of the female genital tract: Secondary | ICD-10-CM | POA: Insufficient documentation

## 2014-06-08 DIAGNOSIS — F419 Anxiety disorder, unspecified: Secondary | ICD-10-CM | POA: Insufficient documentation

## 2014-06-08 DIAGNOSIS — K088 Other specified disorders of teeth and supporting structures: Secondary | ICD-10-CM | POA: Diagnosis present

## 2014-06-08 DIAGNOSIS — K0889 Other specified disorders of teeth and supporting structures: Secondary | ICD-10-CM

## 2014-06-08 DIAGNOSIS — Z791 Long term (current) use of non-steroidal anti-inflammatories (NSAID): Secondary | ICD-10-CM | POA: Diagnosis not present

## 2014-06-08 DIAGNOSIS — G8929 Other chronic pain: Secondary | ICD-10-CM | POA: Insufficient documentation

## 2014-06-08 DIAGNOSIS — Z7952 Long term (current) use of systemic steroids: Secondary | ICD-10-CM | POA: Diagnosis not present

## 2014-06-08 MED ORDER — HYDROCODONE-ACETAMINOPHEN 5-325 MG PO TABS: 1.0000 | ORAL_TABLET | ORAL | Status: DC | PRN

## 2014-06-08 MED ORDER — AMOXICILLIN 500 MG PO CAPS: 500.0000 mg | ORAL_CAPSULE | Freq: Three times a day (TID) | ORAL | Status: DC

## 2014-06-08 MED ORDER — TRAMADOL HCL 50 MG PO TABS: 50.0000 mg | ORAL_TABLET | Freq: Four times a day (QID) | ORAL | Status: DC | PRN

## 2014-06-08 MED ORDER — AMOXICILLIN 250 MG PO CAPS
500.0000 mg | ORAL_CAPSULE | Freq: Once | ORAL | Status: AC
Start: 2014-06-08 — End: 2014-06-08
  Administered 2014-06-08: 500 mg via ORAL
  Filled 2014-06-08: qty 2

## 2014-06-08 NOTE — Discharge Instructions (Signed)

## 2014-06-08 NOTE — ED Notes (Signed)
Left side jaw pain, states she had filling placed approx 2 weeks ago, pain up into left side of face and head

## 2014-06-08 NOTE — ED Provider Notes (Signed)
CSN: [id number]     Arrival date & time 06/08/14  2037 History   First MD Initiated Contact with Patient 06/08/14 2038     No chief complaint on file.    (Consider location/radiation/quality/duration/timing/severity/associated sxs/prior Treatment) Patient is a 23 y.o. female presenting with tooth pain. The history is provided by the patient.  Dental Pain Location:  Upper Upper teeth location:  15/LU 2nd molar Quality:  Throbbing and constant Severity:  Severe Onset quality:  Gradual Duration:  5 hours Timing:  Constant Progression:  Worsening Chronicity:  New Associated symptoms: facial pain    SHONTELL PROSSER is a 23 y.o. female who presents to the ED with left jaw pain. She states that she had a filling placed about 2 weeks ago in to left upper second molar. She began having pain tonight that has gotten worse since it started. She has taken OTC medication for pain without relief. She has an appointment to go back to the dentist Nov. 3rd. The pain was to bad tonight so she came here.   Past Medical History  Diagnosis Date  . Ovarian cyst   . Benign tumor of adrenal gland   . Chronic pelvic pain in female   . Chronic back pain   . Anxiety   . Bronchitis    Past Surgical History  Procedure Laterality Date  . Oophorectomy Right   . Adrenalectomy     Family History  Problem Relation Age of Onset  . Diabetes Maternal Grandmother   . Heart disease Maternal Grandmother   . Diabetes Maternal Grandfather   . Heart disease Maternal Grandfather    History  Substance Use Topics  . Smoking status: Current Every Day Smoker -- 0.50 packs/day    Types: Cigarettes  . Smokeless tobacco: Never Used  . Alcohol Use: No   OB History   Grav Para Term Preterm Abortions TAB SAB Ect Mult Living   0 0             Review of Systems Negative except as stated in HPI   Allergies  Codeine  Home Medications   Prior to Admission medications   Medication Sig Start Date End Date  Taking? Authorizing Provider  albuterol (PROVENTIL HFA;VENTOLIN HFA) 108 (90 BASE) MCG/ACT inhaler Inhale 2 puffs into the lungs every 6 (six) hours as needed for wheezing or shortness of breath.    Historical Provider, MD  albuterol (PROVENTIL HFA;VENTOLIN HFA) 108 (90 BASE) MCG/ACT inhaler Inhale 2 puffs into the lungs every 4 (four) hours as needed for wheezing or shortness of breath. 05/14/14   Merryl Hacker, MD  amoxicillin (AMOXIL) 500 MG capsule Take 1 capsule (500 mg total) by mouth 3 (three) times daily. 05/10/14   Maudry Diego, MD  ascorbic Acid (VITAMIN C) 500 MG CPCR Take 500 mg by mouth daily.    Historical Provider, MD  benzonatate (TESSALON) 100 MG capsule Take 1 capsule (100 mg total) by mouth 3 (three) times daily as needed for cough. 05/14/14   Merryl Hacker, MD  Calcium Carbonate-Vit D-Min (CALCIUM 1200 PO) Take by mouth.    Historical Provider, MD  doxycycline (VIBRAMYCIN) 100 MG capsule Take 1 capsule (100 mg total) by mouth 2 (two) times daily. 01/18/14   Orpah Greek, MD  Gabapentin (NEURONTIN PO) Take by mouth.    Historical Provider, MD  HYDROcodone-acetaminophen (NORCO/VICODIN) 5-325 MG per tablet Take 1-2 tablets by mouth every 4 (four) hours as needed for moderate pain. 01/23/14  Jonnie Kind, MD  HYDROcodone-acetaminophen (NORCO/VICODIN) 5-325 MG per tablet Take 1 tablet by mouth every 6 (six) hours as needed. 05/10/14   Maudry Diego, MD  ibuprofen (ADVIL,MOTRIN) 800 MG tablet Take 800 mg by mouth every 8 (eight) hours as needed. pain    Historical Provider, MD  ibuprofen (ADVIL,MOTRIN) 800 MG tablet Take 1 tablet (800 mg total) by mouth 3 (three) times daily. 01/18/14   Orpah Greek, MD  megestrol (MEGACE) 40 MG tablet Take 40 mg by mouth daily. For breakthrough bleeding    Historical Provider, MD  naproxen (NAPROSYN) 500 MG tablet Take 1 tablet (500 mg total) by mouth 2 (two) times daily. 02/08/14   Hoy Morn, MD  ondansetron (ZOFRAN  ODT) 4 MG disintegrating tablet 4mg  ODT q4 hours prn nausea/vomit 05/19/14   Mariea Clonts, MD  oxyCODONE-acetaminophen (PERCOCET/ROXICET) 5-325 MG per tablet Take 1 tablet by mouth every 4 (four) hours as needed for severe pain. 02/03/14   Evalee Jefferson, PA-C  oxyCODONE-acetaminophen (PERCOCET/ROXICET) 5-325 MG per tablet Take 1 tablet by mouth every 6 (six) hours as needed. 03/19/14   Maudry Diego, MD  predniSONE (DELTASONE) 20 MG tablet Take 3 tablets (60 mg total) by mouth daily with breakfast. 05/14/14   Merryl Hacker, MD   There were no vitals taken for this visit. Physical Exam  Nursing note and vitals reviewed. Constitutional: She is oriented to person, place, and time. She appears well-developed and well-nourished. No distress.  HENT:  Head: Normocephalic.  Mouth/Throat: Uvula is midline, oropharynx is clear and moist and mucous membranes are normal.    Eyes: EOM are normal.  Neck: Neck supple.  Pulmonary/Chest: Effort normal.  Abdominal: Soft. There is no tenderness.  Musculoskeletal: Normal range of motion.  Neurological: She is alert and oriented to person, place, and time. No cranial nerve deficit.  Skin: Skin is warm and dry.  Psychiatric: She has a normal mood and affect. Her behavior is normal.    ED Course  Procedures (including critical care time) Labs Review  MDM  23 y.o. female with dental pain after having a filling placed 2 weeks ago. Stable for discharge. Will treat for infection and pain. She will keep her follow up appointment. She will return here as needed for problems.    Medication List    ASK your doctor about these medications       albuterol 108 (90 BASE) MCG/ACT inhaler  Commonly known as:  PROVENTIL HFA;VENTOLIN HFA  Inhale 2 puffs into the lungs every 6 (six) hours as needed for wheezing or shortness of breath.     albuterol 108 (90 BASE) MCG/ACT inhaler  Commonly known as:  PROVENTIL HFA;VENTOLIN HFA  Inhale 2 puffs into the lungs every 4  (four) hours as needed for wheezing or shortness of breath.     amoxicillin 500 MG capsule  Commonly known as:  AMOXIL  Take 1 capsule (500 mg total) by mouth 3 (three) times daily.  Ask about: Which instructions should I use?     amoxicillin 500 MG capsule  Commonly known as:  AMOXIL  Take 1 capsule (500 mg total) by mouth 3 (three) times daily.  Ask about: Which instructions should I use?     ascorbic Acid 500 MG Cpcr  Commonly known as:  VITAMIN C  Take 500 mg by mouth daily.     benzonatate 100 MG capsule  Commonly known as:  TESSALON  Take 1 capsule (100 mg total) by  mouth 3 (three) times daily as needed for cough.     CALCIUM 1200 PO  Take by mouth.     doxycycline 100 MG capsule  Commonly known as:  VIBRAMYCIN  Take 1 capsule (100 mg total) by mouth 2 (two) times daily.     HYDROcodone-acetaminophen 5-325 MG per tablet  Commonly known as:  NORCO/VICODIN  Take 1-2 tablets by mouth every 4 (four) hours as needed for moderate pain.  Ask about: Which instructions should I use?     HYDROcodone-acetaminophen 5-325 MG per tablet  Commonly known as:  NORCO/VICODIN  Take 1 tablet by mouth every 6 (six) hours as needed.  Ask about: Which instructions should I use?     HYDROcodone-acetaminophen 5-325 MG per tablet  Commonly known as:  NORCO/VICODIN  Take 1 tablet by mouth every 4 (four) hours as needed.  Ask about: Which instructions should I use?     ibuprofen 800 MG tablet  Commonly known as:  ADVIL,MOTRIN  Take 800 mg by mouth every 8 (eight) hours as needed. pain     ibuprofen 800 MG tablet  Commonly known as:  ADVIL,MOTRIN  Take 1 tablet (800 mg total) by mouth 3 (three) times daily.     megestrol 40 MG tablet  Commonly known as:  MEGACE  Take 40 mg by mouth daily. For breakthrough bleeding     naproxen 500 MG tablet  Commonly known as:  NAPROSYN  Take 1 tablet (500 mg total) by mouth 2 (two) times daily.     NEURONTIN PO  Take by mouth.     ondansetron 4  MG disintegrating tablet  Commonly known as:  ZOFRAN ODT  4mg  ODT q4 hours prn nausea/vomit     oxyCODONE-acetaminophen 5-325 MG per tablet  Commonly known as:  PERCOCET/ROXICET  Take 1 tablet by mouth every 4 (four) hours as needed for severe pain.     oxyCODONE-acetaminophen 5-325 MG per tablet  Commonly known as:  PERCOCET/ROXICET  Take 1 tablet by mouth every 6 (six) hours as needed.     predniSONE 20 MG tablet  Commonly known as:  DELTASONE  Take 3 tablets (60 mg total) by mouth daily with breakfast.           Ashley Murrain, NP 06/09/14 0015

## 2014-06-09 NOTE — ED Provider Notes (Signed)
Medical screening examination/treatment/procedure(s) were performed by non-physician practitioner and as supervising physician I was immediately available for consultation/collaboration.   EKG Interpretation None        Orpah Greek, MD 06/09/14 [phone]

## 2014-07-30 ENCOUNTER — Emergency Department (HOSPITAL_COMMUNITY)
Admission: EM | Admit: 2014-07-30 | Discharge: 2014-07-31 | Disposition: A | Discharge status: Home / Self Care | Payer: Medicaid - Out of State | Attending: Emergency Medicine | Admitting: Emergency Medicine

## 2014-07-30 ENCOUNTER — Encounter (HOSPITAL_COMMUNITY): Payer: Self-pay | Admitting: *Deleted

## 2014-07-30 DIAGNOSIS — Z791 Long term (current) use of non-steroidal anti-inflammatories (NSAID): Secondary | ICD-10-CM | POA: Insufficient documentation

## 2014-07-30 DIAGNOSIS — Z86018 Personal history of other benign neoplasm: Secondary | ICD-10-CM | POA: Insufficient documentation

## 2014-07-30 DIAGNOSIS — G8929 Other chronic pain: Secondary | ICD-10-CM | POA: Diagnosis not present

## 2014-07-30 DIAGNOSIS — Z792 Long term (current) use of antibiotics: Secondary | ICD-10-CM | POA: Insufficient documentation

## 2014-07-30 DIAGNOSIS — M778 Other enthesopathies, not elsewhere classified: Secondary | ICD-10-CM | POA: Diagnosis not present

## 2014-07-30 DIAGNOSIS — F419 Anxiety disorder, unspecified: Secondary | ICD-10-CM | POA: Insufficient documentation

## 2014-07-30 DIAGNOSIS — Z7952 Long term (current) use of systemic steroids: Secondary | ICD-10-CM | POA: Diagnosis not present

## 2014-07-30 DIAGNOSIS — Z8709 Personal history of other diseases of the respiratory system: Secondary | ICD-10-CM | POA: Diagnosis not present

## 2014-07-30 DIAGNOSIS — Z79899 Other long term (current) drug therapy: Secondary | ICD-10-CM | POA: Insufficient documentation

## 2014-07-30 DIAGNOSIS — Z720 Tobacco use: Secondary | ICD-10-CM | POA: Diagnosis not present

## 2014-07-30 DIAGNOSIS — G43909 Migraine, unspecified, not intractable, without status migrainosus: Secondary | ICD-10-CM | POA: Insufficient documentation

## 2014-07-30 DIAGNOSIS — Z8742 Personal history of other diseases of the female genital tract: Secondary | ICD-10-CM | POA: Insufficient documentation

## 2014-07-30 DIAGNOSIS — G43009 Migraine without aura, not intractable, without status migrainosus: Secondary | ICD-10-CM

## 2014-07-30 DIAGNOSIS — R51 Headache: Secondary | ICD-10-CM | POA: Diagnosis present

## 2014-07-30 MED ORDER — SODIUM CHLORIDE 0.9 % IV BOLUS (SEPSIS)
1000.0000 mL | Freq: Once | INTRAVENOUS | Status: AC
  Administered 2014-07-30: 1000 mL via INTRAVENOUS

## 2014-07-30 MED ORDER — DIPHENHYDRAMINE HCL 50 MG/ML IJ SOLN
50.0000 mg | Freq: Once | INTRAMUSCULAR | Status: AC
Start: 2014-07-30 — End: 2014-07-30
  Administered 2014-07-30: 50 mg via INTRAVENOUS
  Filled 2014-07-30: qty 1

## 2014-07-30 MED ORDER — KETOROLAC TROMETHAMINE 30 MG/ML IJ SOLN
30.0000 mg | Freq: Once | INTRAMUSCULAR | Status: AC
  Administered 2014-07-30: 30 mg via INTRAVENOUS
  Filled 2014-07-30: qty 1

## 2014-07-30 MED ORDER — METOCLOPRAMIDE HCL 5 MG/ML IJ SOLN
10.0000 mg | Freq: Once | INTRAMUSCULAR | Status: AC
  Administered 2014-07-30: 10 mg via INTRAVENOUS
  Filled 2014-07-30: qty 2

## 2014-07-30 MED ORDER — DEXAMETHASONE SODIUM PHOSPHATE 4 MG/ML IJ SOLN
10.0000 mg | Freq: Once | INTRAMUSCULAR | Status: AC
  Administered 2014-07-30: 10 mg via INTRAVENOUS
  Filled 2014-07-30: qty 3

## 2014-07-30 NOTE — ED Provider Notes (Signed)
This chart was scribed for [name], DO by [name], ED Scribe. This patient was seen in room APA09/APA09 and the patient's care was started at 11:21 PM.   TIME SEEN: 11:21 PM  CHIEF COMPLAINT: Head Pressure and Left Arm Pain  HPI: HPI Comments: Colleen Ortiz is a 23 y.o. 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 Pt notes photophobia as an associated symptom. She states nothing makes pain worse or better. She has tried Ibuprofen 800 and Naproxen with no relief. Pt denies a history of headaches and similar symptoms.   Pt also complains of intermittent shooting pain in left hand. Pt denies recent injuries  No swelling, warmth or erythema.  She denies numbness or focal weakness, nausea and vomiting as associated symptoms. No recent fever, neck pain, head injury.  Not on anticoagulation.  ROS: See HPI Constitutional: no fever  Eyes: no drainage; + photophobia  ENT: no runny nose   Cardiovascular:  no chest pain  Resp: no SOB  GI: no vomiting; no nausea GU: no dysuria Integumentary: no rash  Allergy: no hives  Musculoskeletal: no leg swelling  Neurological: no slurred speech; pressure-like headache; photophobia, no numbness ROS otherwise negative  PAST MEDICAL HISTORY/PAST SURGICAL HISTORY:  Past Medical History  Diagnosis Date  . Ovarian cyst   . Benign tumor of adrenal gland   . Chronic pelvic pain in female   . Chronic back pain   . Anxiety   . Bronchitis     MEDICATIONS:  Prior to Admission medications   Medication Sig Start Date End Date Taking? Authorizing Provider  albuterol (PROVENTIL HFA;VENTOLIN HFA) 108 (90 BASE) MCG/ACT inhaler Inhale 2 puffs into the lungs every 6 (six) hours as needed for wheezing or shortness of breath.    Historical  Provider, MD  albuterol (PROVENTIL HFA;VENTOLIN HFA) 108 (90 BASE) MCG/ACT inhaler Inhale 2 puffs into the lungs every 4 (four) hours as needed for wheezing or shortness of breath. 05/14/14   Merryl Hacker, MD  amoxicillin (AMOXIL) 500 MG capsule Take 1 capsule (500 mg total) by mouth 3 (three) times daily. 05/10/14   Maudry Diego, MD  amoxicillin (AMOXIL) 500 MG capsule Take 1 capsule (500 mg total) by mouth 3 (three) times daily. 06/08/14   [name], NP  ascorbic Acid (VITAMIN C) 500 MG CPCR Take 500 mg by mouth daily.    Historical Provider, MD  benzonatate (TESSALON) 100 MG capsule Take 1 capsule (100 mg total) by mouth 3 (three) times daily as needed for cough. 05/14/14   Merryl Hacker, MD  Calcium Carbonate-Vit D-Min (CALCIUM 1200 PO) Take by mouth.    Historical Provider, MD  doxycycline (VIBRAMYCIN) 100 MG capsule Take 1 capsule (100 mg total) by mouth 2 (two) times daily. 01/18/14   Orpah Greek, MD  Gabapentin (NEURONTIN PO) Take by mouth.    Historical Provider, MD  HYDROcodone-acetaminophen (NORCO/VICODIN) 5-325 MG per tablet Take 1-2 tablets by mouth every 4 (four) hours as needed for moderate pain. 01/23/14   Jonnie Kind, MD  HYDROcodone-acetaminophen (NORCO/VICODIN) 5-325 MG per tablet Take 1 tablet by mouth every 6 (six) hours as needed. 05/10/14   Maudry Diego, MD  HYDROcodone-acetaminophen (NORCO/VICODIN) 5-325 MG per tablet Take 1 tablet by mouth every 4 (four) hours as needed. 06/08/14  [name], NP  ibuprofen (ADVIL,MOTRIN) 800 MG tablet Take 800 mg by mouth every 8 (eight) hours as needed. pain    Historical Provider, MD  ibuprofen (ADVIL,MOTRIN) 800 MG tablet Take 1 tablet (800 mg total) by mouth 3 (three) times daily. 01/18/14   Orpah Greek, MD  megestrol (MEGACE) 40 MG tablet Take 40 mg by mouth daily. For breakthrough bleeding    Historical Provider, MD  naproxen (NAPROSYN) 500 MG tablet Take 1 tablet (500 mg total) by mouth 2 (two)  times daily. 02/08/14   Hoy Morn, MD  ondansetron (ZOFRAN ODT) 4 MG disintegrating tablet 4mg  ODT q4 hours prn nausea/vomit 05/19/14   Mariea Clonts, MD  oxyCODONE-acetaminophen (PERCOCET/ROXICET) 5-325 MG per tablet Take 1 tablet by mouth every 4 (four) hours as needed for severe pain. 02/03/14   Evalee Jefferson, PA-C  oxyCODONE-acetaminophen (PERCOCET/ROXICET) 5-325 MG per tablet Take 1 tablet by mouth every 6 (six) hours as needed. 03/19/14   Maudry Diego, MD  predniSONE (DELTASONE) 20 MG tablet Take 3 tablets (60 mg total) by mouth daily with breakfast. 05/14/14   Merryl Hacker, MD    ALLERGIES:  No Known Allergies  SOCIAL HISTORY:  History  Substance Use Topics  . Smoking status: Current Every Day Smoker -- 0.50 packs/day    Types: Cigarettes  . Smokeless tobacco: Never Used  . Alcohol Use: No    FAMILY HISTORY: Family History  Problem Relation Age of Onset  . Diabetes Maternal Grandmother   . Heart disease Maternal Grandmother   . Diabetes Maternal Grandfather   . Heart disease Maternal Grandfather     EXAM: BP 118/83 mmHg  Pulse 67  Temp(Src) 98.5 F (36.9 C) (Oral)  Resp 20  Ht 5' 5.5" (1.664 m)  Wt 200 lb (90.719 kg)  BMI 32.76 kg/m2  SpO2 100% CONSTITUTIONAL: Alert and oriented and responds appropriately to questions. Well-appearing; well-nourished; appears uncomfortable but nontoxic HEAD: Normocephalic EYES: Conjunctivae clear, PERRL; photophobia ENT: normal nose; no rhinorrhea; moist mucous membranes; pharynx without lesions noted NECK: Supple, no meningismus, no LAD  CARD: RRR; S1 and S2 appreciated; no murmurs, no clicks, no rubs, no gallops RESP: Normal chest excursion without splinting or tachypnea; breath sounds clear and equal bilaterally; no wheezes, no rhonchi, no rales, no hypoxia or respiratory distress ABD/GI: Normal bowel sounds; non-distended; soft, non-tender, no rebound, no guarding BACK:  The back appears normal and is non-tender to  palpation, there is no CVA tenderness EXT: Tender over the left wrist without joint effusion, bony deformity, erythema or warmth, full range of motion in this joint, normal grip strength, 2+ radial pulses bilaterally, Normal ROM in all joints; compartments soft, otherwise extremities are non-tender to palpation; no edema; normal capillary refill; no cyanosis    SKIN: Normal color for age and race; warm NEURO: Moves all extremities equally; sensation to light touches intact diffusely; cranial nerves II-XII intact PSYCH: The patient's mood and manner are appropriate. Grooming and personal hygiene are appropriate.  MEDICAL DECISION MAKING: Patient here with what appears to be a migraine headache. She's also complaining of left hand and wrist pain without any injury. No sign of septic arthritis or cellulitis. She is neurovascularly intact distally. I do not feel she needs imaging of her head and wrist at this time. I'm not concerned for intracranial hemorrhage, infarct, mass, cavernous sinus thrombosis. Will treat with Toradol, Reglan, Benadryl, Decadron and IV fluids and reassess.  ED PROGRESS: Patient reports her headache is gone  after Toradol, Reglan, Benadryl, Decadron and IV fluids. She is still complaining of left wrist pain. Suspect tendinitis versus strain.  Discharge patient with prescription for Fioricet to take as needed for her headache. Have advised her to continue ibuprofen for her wrist pain. Discussed return precautions. Have advised her to follow-up with PCP for further management for her migraines. Discussed with her that she may need to be on prophylactic medications for her migraine headache. She verbalized understanding and is comfortable with plan.   I personally performed the services described in this documentation, which was scribed in my presence. The recorded information has been reviewed and is accurate.    [place], DO 07/31/14 [id number]

## 2014-07-30 NOTE — ED Notes (Signed)
Patient in bed at this time. Friend at bedside. Patient states she had teeth removed a month ago and tonight she feels like her "brain is swelling" patient also states her left forearm and wrist has pain.

## 2014-07-30 NOTE — ED Notes (Signed)
Pt states x 1 week the right side of her head/brain has been swelling on and off. Pt states that at some points she feels dizzy and has lower left arm pain and weakness.

## 2014-07-31 MED ORDER — BUTALBITAL-APAP-CAFFEINE 50-325-40 MG PO TABS: 1.0000 | ORAL_TABLET | Freq: Four times a day (QID) | ORAL | Status: AC | PRN

## 2014-07-31 NOTE — Discharge Instructions (Signed)
You may continue ibuprofen 800 mg every 8 hours as needed for pain for your left wrist pain. You may take Fioricet as needed for your migraine headaches. I recommend you follow-up with your primary care physician for both your left wrist pain and your migraine headaches.   Migraine Headache A migraine headache is an intense, throbbing pain on one or both sides of your head. A migraine can last for 30 minutes to several hours. CAUSES  The exact cause of a migraine headache is not always known. However, a migraine may be caused when nerves in the brain become irritated and release chemicals that cause inflammation. This causes pain. Certain things may also trigger migraines, such as:  Alcohol.  Smoking.  Stress.  Menstruation.  Aged cheeses.  Foods or drinks that contain nitrates, glutamate, aspartame, or tyramine.  Lack of sleep.  Chocolate.  Caffeine.  Hunger.  Physical exertion.  Fatigue.  Medicines used to treat chest pain (nitroglycerine), birth control pills, estrogen, and some blood pressure medicines. SIGNS AND SYMPTOMS  Pain on one or both sides of your head.  Pulsating or throbbing pain.  Severe pain that prevents daily activities.  Pain that is aggravated by any physical activity.  Nausea, vomiting, or both.  Dizziness.  Pain with exposure to bright lights, loud noises, or activity.  General sensitivity to bright lights, loud noises, or smells. Before you get a migraine, you may get warning signs that a migraine is coming (aura). An aura may include:  Seeing flashing lights.  Seeing bright spots, halos, or zigzag lines.  Having tunnel vision or blurred vision.  Having feelings of numbness or tingling.  Having trouble talking.  Having muscle weakness. DIAGNOSIS  A migraine headache is often diagnosed based on:  Symptoms.  Physical exam.  A CT scan or MRI of your head. These imaging tests cannot diagnose migraines, but they can help rule  out other causes of headaches. TREATMENT Medicines may be given for pain and nausea. Medicines can also be given to help prevent recurrent migraines.  HOME CARE INSTRUCTIONS  Only take over-the-counter or prescription medicines for pain or discomfort as directed by your health care provider. The use of long-term narcotics is not recommended.  Lie down in a dark, quiet room when you have a migraine.  Keep a journal to find out what may trigger your migraine headaches. For example, write down:  What you eat and drink.  How much sleep you get.  Any change to your diet or medicines.  Limit alcohol consumption.  Quit smoking if you smoke.  Get 7-9 hours of sleep, or as recommended by your health care provider.  Limit stress.  Keep lights dim if bright lights bother you and make your migraines worse. SEEK IMMEDIATE MEDICAL CARE IF:   Your migraine becomes severe.  You have a fever.  You have a stiff neck.  You have vision loss.  You have muscular weakness or loss of muscle control.  You start losing your balance or have trouble walking.  You feel faint or pass out.  You have severe symptoms that are different from your first symptoms. MAKE SURE YOU:   Understand these instructions.  Will watch your condition.  Will get help right away if you are not doing well or get worse. Document Released: 08/09/2005 Document Revised: 12/24/2013 Document Reviewed: 04/16/2013 [facility] Patient Information 2015 [place], [place]. This information is not intended to replace advice given to you by your health care provider. Make sure you discuss  any questions you have with your health care provider.  Tendinitis Tendinitis is swelling and inflammation of the tendons. Tendons are band-like tissues that connect muscle to bone. Tendinitis commonly occurs in the:   Shoulders (rotator cuff).  Heels (Achilles tendon).  Elbows (triceps tendon). CAUSES Tendinitis is usually caused by  overusing the tendon, muscles, and joints involved. When the tissue surrounding a tendon (synovium) becomes inflamed, it is called tenosynovitis. Tendinitis commonly develops in people whose jobs require repetitive motions. SYMPTOMS  Pain.  Tenderness.  Mild swelling. DIAGNOSIS Tendinitis is usually diagnosed by physical exam. Your health care provider may also order X-rays or other imaging tests. TREATMENT Your health care provider may recommend certain medicines or exercises for your treatment. HOME CARE INSTRUCTIONS   Use a sling or splint for as long as directed by your health care provider until the pain decreases.  Put ice on the injured area.  Put ice in a plastic bag.  Place a towel between your skin and the bag.  Leave the ice on for 15-20 minutes, 3-4 times a day, or as directed by your health care provider.  Avoid using the limb while the tendon is painful. Perform gentle range of motion exercises only as directed by your health care provider. Stop exercises if pain or discomfort increase, unless directed otherwise by your health care provider.  Only take over-the-counter or prescription medicines for pain, discomfort, or fever as directed by your health care provider. SEEK MEDICAL CARE IF:   Your pain and swelling increase.  You develop new, unexplained symptoms, especially increased numbness in the hands. MAKE SURE YOU:   Understand these instructions.  Will watch your condition.  Will get help right away if you are not doing well or get worse. Document Released: 08/06/2000 Document Revised: 12/24/2013 Document Reviewed: 10/26/2010 [facility] Patient Information 2015 [place], [place]. This information is not intended to replace advice given to you by your health care provider. Make sure you discuss any questions you have with your health care provider.

## 2014-07-31 NOTE — ED Notes (Signed)
Patient verbalizes understanding of discharge instructions, prescription medications, home care and follow up care. Patient ambulatory out of department at this time with friend 

## 2014-08-11 ENCOUNTER — Emergency Department (HOSPITAL_COMMUNITY): Payer: Medicaid - Out of State

## 2014-08-11 ENCOUNTER — Emergency Department (HOSPITAL_COMMUNITY)
Admission: EM | Admit: 2014-08-11 | Discharge: 2014-08-11 | Disposition: A | Discharge status: Home / Self Care | Payer: Medicaid - Out of State | Attending: Emergency Medicine | Admitting: Emergency Medicine

## 2014-08-11 ENCOUNTER — Encounter (HOSPITAL_COMMUNITY): Payer: Self-pay | Admitting: Emergency Medicine

## 2014-08-11 DIAGNOSIS — R51 Headache: Secondary | ICD-10-CM | POA: Insufficient documentation

## 2014-08-11 DIAGNOSIS — N898 Other specified noninflammatory disorders of vagina: Secondary | ICD-10-CM | POA: Insufficient documentation

## 2014-08-11 DIAGNOSIS — G8929 Other chronic pain: Secondary | ICD-10-CM | POA: Insufficient documentation

## 2014-08-11 DIAGNOSIS — M791 Myalgia, unspecified site: Secondary | ICD-10-CM

## 2014-08-11 DIAGNOSIS — R110 Nausea: Secondary | ICD-10-CM | POA: Insufficient documentation

## 2014-08-11 DIAGNOSIS — Z791 Long term (current) use of non-steroidal anti-inflammatories (NSAID): Secondary | ICD-10-CM | POA: Diagnosis not present

## 2014-08-11 DIAGNOSIS — Z8709 Personal history of other diseases of the respiratory system: Secondary | ICD-10-CM | POA: Diagnosis not present

## 2014-08-11 DIAGNOSIS — Z79899 Other long term (current) drug therapy: Secondary | ICD-10-CM | POA: Diagnosis not present

## 2014-08-11 DIAGNOSIS — F419 Anxiety disorder, unspecified: Secondary | ICD-10-CM | POA: Diagnosis not present

## 2014-08-11 DIAGNOSIS — Z86018 Personal history of other benign neoplasm: Secondary | ICD-10-CM | POA: Diagnosis not present

## 2014-08-11 DIAGNOSIS — Z7952 Long term (current) use of systemic steroids: Secondary | ICD-10-CM | POA: Insufficient documentation

## 2014-08-11 DIAGNOSIS — Z720 Tobacco use: Secondary | ICD-10-CM | POA: Insufficient documentation

## 2014-08-11 DIAGNOSIS — Z792 Long term (current) use of antibiotics: Secondary | ICD-10-CM | POA: Diagnosis not present

## 2014-08-11 DIAGNOSIS — R519 Headache, unspecified: Secondary | ICD-10-CM

## 2014-08-11 LAB — URINALYSIS, ROUTINE W REFLEX MICROSCOPIC
BILIRUBIN URINE: NEGATIVE
Glucose, UA: NEGATIVE mg/dL
Hgb urine dipstick: NEGATIVE
KETONES UR: NEGATIVE mg/dL
Leukocytes, UA: NEGATIVE
NITRITE: NEGATIVE
PROTEIN: NEGATIVE mg/dL
Specific Gravity, Urine: 1.02 (ref 1.005–1.030)
Urobilinogen, UA: 0.2 mg/dL (ref 0.0–1.0)
pH: 7.5 (ref 5.0–8.0)

## 2014-08-11 LAB — CBC WITH DIFFERENTIAL/PLATELET
Basophils Absolute: 0 10*3/uL (ref 0.0–0.1)
Basophils Relative: 0 % (ref 0–1)
EOS ABS: 0.4 10*3/uL (ref 0.0–0.7)
Eosinophils Relative: 4 % (ref 0–5)
HEMATOCRIT: 41 % (ref 36.0–46.0)
HEMOGLOBIN: 13.6 g/dL (ref 12.0–15.0)
Lymphocytes Relative: 33 % (ref 12–46)
Lymphs Abs: 3.1 10*3/uL (ref 0.7–4.0)
MCH: 31.1 pg (ref 26.0–34.0)
MCHC: 33.2 g/dL (ref 30.0–36.0)
MCV: 93.6 fL (ref 78.0–100.0)
MONO ABS: 0.7 10*3/uL (ref 0.1–1.0)
MONOS PCT: 7 % (ref 3–12)
NEUTROS PCT: 56 % (ref 43–77)
Neutro Abs: 5.1 10*3/uL (ref 1.7–7.7)
Platelets: 285 10*3/uL (ref 150–400)
RBC: 4.38 MIL/uL (ref 3.87–5.11)
RDW: 12.4 % (ref 11.5–15.5)
WBC: 9.3 10*3/uL (ref 4.0–10.5)

## 2014-08-11 LAB — COMPREHENSIVE METABOLIC PANEL
ALK PHOS: 70 U/L (ref 39–117)
ALT: 29 U/L (ref 0–35)
AST: 22 U/L (ref 0–37)
Albumin: 3.8 g/dL (ref 3.5–5.2)
Anion gap: 13 (ref 5–15)
BILIRUBIN TOTAL: 0.2 mg/dL — AB (ref 0.3–1.2)
BUN: 18 mg/dL (ref 6–23)
CO2: 23 mEq/L (ref 19–32)
CREATININE: 0.98 mg/dL (ref 0.50–1.10)
Calcium: 9.5 mg/dL (ref 8.4–10.5)
Chloride: 106 mEq/L (ref 96–112)
GFR calc Af Amer: >90 mL/min (ref >90)
GFR calc non Af Amer: 80 mL/min — ABNORMAL LOW (ref >90)
GLUCOSE: 92 mg/dL (ref 70–99)
POTASSIUM: 4.2 meq/L (ref 3.7–5.3)
Sodium: 142 mEq/L (ref 137–147)
Total Protein: 7.6 g/dL (ref 6.0–8.3)

## 2014-08-11 LAB — WET PREP, GENITAL
Clue Cells Wet Prep HPF POC: NONE SEEN
Trich, Wet Prep: NONE SEEN
Yeast Wet Prep HPF POC: NONE SEEN

## 2014-08-11 LAB — POC URINE PREG, ED: Preg Test, Ur: NEGATIVE

## 2014-08-11 LAB — CK: Total CK: 109 U/L (ref 7–177)

## 2014-08-11 LAB — CBG MONITORING, ED: Glucose-Capillary: 85 mg/dL (ref 70–99)

## 2014-08-11 MED ORDER — MORPHINE SULFATE 4 MG/ML IJ SOLN
4.0000 mg | Freq: Once | INTRAMUSCULAR | Status: AC
  Administered 2014-08-11: 4 mg via INTRAVENOUS
  Filled 2014-08-11: qty 1

## 2014-08-11 MED ORDER — IBUPROFEN 800 MG PO TABS: 800.0000 mg | ORAL_TABLET | Freq: Three times a day (TID) | ORAL | Status: DC | PRN

## 2014-08-11 MED ORDER — SODIUM CHLORIDE 0.9 % IV BOLUS (SEPSIS)
1000.0000 mL | Freq: Once | INTRAVENOUS | Status: AC
Start: 2014-08-11 — End: 2014-08-11
  Administered 2014-08-11: 1000 mL via INTRAVENOUS

## 2014-08-11 MED ORDER — ONDANSETRON 4 MG PO TBDP: 4.0000 mg | ORAL_TABLET | Freq: Three times a day (TID) | ORAL | Status: DC | PRN

## 2014-08-11 MED ORDER — ONDANSETRON HCL 4 MG/2ML IJ SOLN: 4.0000 mg | Freq: Once | INTRAMUSCULAR | Status: DC

## 2014-08-11 MED ORDER — KETOROLAC TROMETHAMINE 30 MG/ML IJ SOLN
30.0000 mg | Freq: Once | INTRAMUSCULAR | Status: AC
  Administered 2014-08-11: 30 mg via INTRAVENOUS
  Filled 2014-08-11: qty 1

## 2014-08-11 MED ORDER — METOCLOPRAMIDE HCL 5 MG/ML IJ SOLN
10.0000 mg | Freq: Once | INTRAMUSCULAR | Status: AC
  Administered 2014-08-11: 10 mg via INTRAVENOUS
  Filled 2014-08-11: qty 2

## 2014-08-11 MED ORDER — DIPHENHYDRAMINE HCL 50 MG/ML IJ SOLN
25.0000 mg | Freq: Once | INTRAMUSCULAR | Status: AC
  Administered 2014-08-11: 25 mg via INTRAVENOUS
  Filled 2014-08-11: qty 1

## 2014-08-11 NOTE — Discharge Instructions (Signed)
It is unclear what is causing your symptoms but your blood work today, urine, pelvic cultures so far have been unremarkable. I feel you're safe to go home. I do not feel there is any life-threatening illness present.  I recommended if you're still symptomatic that you follow-up with your primary care physician. You may have a viral illness causing you to have headaches, nausea, body aches. You may take ibuprofen for pain and Zofran for nausea. Your head CT was normal.   General Headache Without Cause A headache is pain or discomfort felt around the head or neck area. The specific cause of a headache may not be found. There are many causes and types of headaches. A few common ones are:  Tension headaches.  Migraine headaches.  Cluster headaches.  Chronic daily headaches. HOME CARE INSTRUCTIONS   Keep all follow-up appointments with your caregiver or any specialist referral.  Only take over-the-counter or prescription medicines for pain or discomfort as directed by your caregiver.  Lie down in a dark, quiet room when you have a headache.  Keep a headache journal to find out what may trigger your migraine headaches. For example, write down:  What you eat and drink.  How much sleep you get.  Any change to your diet or medicines.  Try massage or other relaxation techniques.  Put ice packs or heat on the head and neck. Use these 3 to 4 times per day for 15 to 20 minutes each time, or as needed.  Limit stress.  Sit up straight, and do not tense your muscles.  Quit smoking if you smoke.  Limit alcohol use.  Decrease the amount of caffeine you drink, or stop drinking caffeine.  Eat and sleep on a regular schedule.  Get 7 to 9 hours of sleep, or as recommended by your caregiver.  Keep lights dim if bright lights bother you and make your headaches worse. SEEK MEDICAL CARE IF:   You have problems with the medicines you were prescribed.  Your medicines are not working.  You  have a change from the usual headache.  You have nausea or vomiting. SEEK IMMEDIATE MEDICAL CARE IF:   Your headache becomes severe.  You have a fever.  You have a stiff neck.  You have loss of vision.  You have muscular weakness or loss of muscle control.  You start losing your balance or have trouble walking.  You feel faint or pass out.  You have severe symptoms that are different from your first symptoms. MAKE SURE YOU:   Understand these instructions.  Will watch your condition.  Will get help right away if you are not doing well or get worse. Document Released: 08/09/2005 Document Revised: 11/01/2011 Document Reviewed: 08/25/2011 [facility] Patient Information 2015 [place], [place]. This information is not intended to replace advice given to you by your health care provider. Make sure you discuss any questions you have with your health care provider.  Nausea and Vomiting Nausea is a sick feeling that often comes before throwing up (vomiting). Vomiting is a reflex where stomach contents come out of your mouth. Vomiting can cause severe loss of body fluids (dehydration). Children and elderly adults can become dehydrated quickly, especially if they also have diarrhea. Nausea and vomiting are symptoms of a condition or disease. It is important to find the cause of your symptoms. CAUSES   Direct irritation of the stomach lining. This irritation can result from increased acid production (gastroesophageal reflux disease), infection, food poisoning, taking certain medicines (  such as nonsteroidal anti-inflammatory drugs), alcohol use, or tobacco use.  Signals from the brain.These signals could be caused by a headache, heat exposure, an inner ear disturbance, increased pressure in the brain from injury, infection, a tumor, or a concussion, pain, emotional stimulus, or metabolic problems.  An obstruction in the gastrointestinal tract (bowel obstruction).  Illnesses such as diabetes,  hepatitis, gallbladder problems, appendicitis, kidney problems, cancer, sepsis, atypical symptoms of a heart attack, or eating disorders.  Medical treatments such as chemotherapy and radiation.  Receiving medicine that makes you sleep (general anesthetic) during surgery. DIAGNOSIS Your caregiver may ask for tests to be done if the problems do not improve after a few days. Tests may also be done if symptoms are severe or if the reason for the nausea and vomiting is not clear. Tests may include:  Urine tests.  Blood tests.  Stool tests.  Cultures (to look for evidence of infection).  X-rays or other imaging studies. Test results can help your caregiver make decisions about treatment or the need for additional tests. TREATMENT You need to stay well hydrated. Drink frequently but in small amounts.You may wish to drink water, sports drinks, clear broth, or eat frozen ice pops or gelatin dessert to help stay hydrated.When you eat, eating slowly may help prevent nausea.There are also some antinausea medicines that may help prevent nausea. HOME CARE INSTRUCTIONS   Take all medicine as directed by your caregiver.  If you do not have an appetite, do not force yourself to eat. However, you must continue to drink fluids.  If you have an appetite, eat a normal diet unless your caregiver tells you differently.  Eat a variety of complex carbohydrates (rice, wheat, potatoes, bread), lean meats, yogurt, fruits, and vegetables.  Avoid high-fat foods because they are more difficult to digest.  Drink enough water and fluids to keep your urine clear or pale yellow.  If you are dehydrated, ask your caregiver for specific rehydration instructions. Signs of dehydration may include:  Severe thirst.  Dry lips and mouth.  Dizziness.  Dark urine.  Decreasing urine frequency and amount.  Confusion.  Rapid breathing or pulse. SEEK IMMEDIATE MEDICAL CARE IF:   You have blood or brown flecks  (like coffee grounds) in your vomit.  You have black or bloody stools.  You have a severe headache or stiff neck.  You are confused.  You have severe abdominal pain.  You have chest pain or trouble breathing.  You do not urinate at least once every 8 hours.  You develop cold or clammy skin.  You continue to vomit for longer than 24 to 48 hours.  You have a fever. MAKE SURE YOU:   Understand these instructions.  Will watch your condition.  Will get help right away if you are not doing well or get worse. Document Released: 08/09/2005 Document Revised: 11/01/2011 Document Reviewed: 01/06/2011 [facility] Patient Information 2015 [place], [place]. This information is not intended to replace advice given to you by your health care provider. Make sure you discuss any questions you have with your health care provider.

## 2014-08-11 NOTE — ED Notes (Signed)
Patient verbalizes understanding of discharge instructions, prescription medications, home care and follow up care if needed. Patient ambulatory out of department at this time escorted by family.

## 2014-08-11 NOTE — ED Provider Notes (Signed)
This chart was scribed for Lynch, DO by Peyton Bottoms, ED Scribe. This patient was seen in room APA18/APA18 and the patient's care was started at 6:22 PM.  TIME SEEN: 6:23 PM   CHIEF COMPLAINT: Migraine; Generalized bodyaches, nausea, vaginal discharge  HPI: Patient is a 23 y.o. female presenting to the ED today with complaints of moderate diffuse throbbing HA and generalized body aches, nausea. Patient reports that the HA she is having today is different from typical migraine symptoms she has had in the past. She states "something does not feel right in my head". She reports associated nausea and a "painful tingling" reported in her feet, forearms and back intermittently but has none now. No numbness or focal weakness.  No bowel or bladder incontinence.  No neck pain or stiffness.  She also reports being very thirsty. She denies associated fevers or chills. She states that she has had a sick contact with a relative who had pneumonia. She denies associated head injury. She also reports vaginal discharge that began a few days ago.  Sexually active with female partner.  LMP in November.  Denies abdominal pain.  No vaginal bleeding.  ROS: See HPI Constitutional: no fever  Eyes: no drainage  ENT: no runny nose   Cardiovascular:  no chest pain  Resp: no SOB  GI: no vomiting; Nausea GU: no dysuria; Vaginal Discharge Integumentary: no rash  Allergy: no hives  Musculoskeletal: no leg swelling  Neurological: no slurred speech; headache ROS otherwise negative  PAST MEDICAL HISTORY/PAST SURGICAL HISTORY:  Past Medical History  Diagnosis Date  . Ovarian cyst   . Benign tumor of adrenal gland   . Chronic pelvic pain in female   . Chronic back pain   . Anxiety   . Bronchitis     MEDICATIONS:  Prior to Admission medications   Medication Sig Start Date End Date Taking? Authorizing Provider  albuterol (PROVENTIL HFA;VENTOLIN HFA) 108 (90 BASE) MCG/ACT inhaler Inhale 2 puffs into the  lungs every 6 (six) hours as needed for wheezing or shortness of breath.    Historical Provider, MD  albuterol (PROVENTIL HFA;VENTOLIN HFA) 108 (90 BASE) MCG/ACT inhaler Inhale 2 puffs into the lungs every 4 (four) hours as needed for wheezing or shortness of breath. 05/14/14   Merryl Hacker, MD  amoxicillin (AMOXIL) 500 MG capsule Take 1 capsule (500 mg total) by mouth 3 (three) times daily. 05/10/14   Maudry Diego, MD  amoxicillin (AMOXIL) 500 MG capsule Take 1 capsule (500 mg total) by mouth 3 (three) times daily. 06/08/14   [name], NP  ascorbic Acid (VITAMIN C) 500 MG CPCR Take 500 mg by mouth daily.    Historical Provider, MD  benzonatate (TESSALON) 100 MG capsule Take 1 capsule (100 mg total) by mouth 3 (three) times daily as needed for cough. 05/14/14   Merryl Hacker, MD  butalbital-acetaminophen-caffeine (FIORICET) 8086708971 MG per tablet Take 1-2 tablets by mouth every 6 (six) hours as needed for headache. 07/31/14 07/31/15  Abdulahad Mederos N Shavelle Runkel, DO  Calcium Carbonate-Vit D-Min (CALCIUM 1200 PO) Take by mouth.    Historical Provider, MD  doxycycline (VIBRAMYCIN) 100 MG capsule Take 1 capsule (100 mg total) by mouth 2 (two) times daily. 01/18/14   Orpah Greek, MD  Gabapentin (NEURONTIN PO) Take by mouth.    Historical Provider, MD  HYDROcodone-acetaminophen (NORCO/VICODIN) 5-325 MG per tablet Take 1-2 tablets by mouth every 4 (four) hours as needed for moderate pain. 01/23/14  Jonnie Kind, MD  HYDROcodone-acetaminophen (NORCO/VICODIN) 5-325 MG per tablet Take 1 tablet by mouth every 6 (six) hours as needed. 05/10/14   Maudry Diego, MD  HYDROcodone-acetaminophen (NORCO/VICODIN) 5-325 MG per tablet Take 1 tablet by mouth every 4 (four) hours as needed. 06/08/14   [name], NP  ibuprofen (ADVIL,MOTRIN) 800 MG tablet Take 800 mg by mouth every 8 (eight) hours as needed. pain    Historical Provider, MD  ibuprofen (ADVIL,MOTRIN) 800 MG tablet Take 1 tablet (800 mg total) by  mouth 3 (three) times daily. 01/18/14   Orpah Greek, MD  megestrol (MEGACE) 40 MG tablet Take 40 mg by mouth daily. For breakthrough bleeding    Historical Provider, MD  naproxen (NAPROSYN) 500 MG tablet Take 1 tablet (500 mg total) by mouth 2 (two) times daily. 02/08/14   Hoy Morn, MD  ondansetron (ZOFRAN ODT) 4 MG disintegrating tablet 4mg  ODT q4 hours prn nausea/vomit 05/19/14   Mariea Clonts, MD  oxyCODONE-acetaminophen (PERCOCET/ROXICET) 5-325 MG per tablet Take 1 tablet by mouth every 4 (four) hours as needed for severe pain. 02/03/14   Evalee Jefferson, PA-C  oxyCODONE-acetaminophen (PERCOCET/ROXICET) 5-325 MG per tablet Take 1 tablet by mouth every 6 (six) hours as needed. 03/19/14   Maudry Diego, MD  predniSONE (DELTASONE) 20 MG tablet Take 3 tablets (60 mg total) by mouth daily with breakfast. 05/14/14   Merryl Hacker, MD   ALLERGIES:  No Known Allergies  SOCIAL HISTORY:  History  Substance Use Topics  . Smoking status: Current Every Day Smoker -- 0.50 packs/day for 10 years    Types: Cigarettes  . Smokeless tobacco: Never Used  . Alcohol Use: No    FAMILY HISTORY: Family History  Problem Relation Age of Onset  . Diabetes Maternal Grandmother   . Heart disease Maternal Grandmother   . Diabetes Maternal Grandfather   . Heart disease Maternal Grandfather     EXAM:  Triage Vitals: BP 127/81 mmHg  Pulse 79  Temp(Src) 98.2 F (36.8 C) (Oral)  Resp 24  Ht 5' 5.5" (1.664 m)  Wt 189 lb (85.73 kg)  BMI 30.96 kg/m2  SpO2 100%  CONSTITUTIONAL: Alert and oriented and responds appropriately to questions. Well-appearing; well-nourished; appears uncomfortable but in NAD, nontoxic HEAD: Normocephalic EYES: Conjunctivae clear, PERRL; no photophobia, EOMI ENT: normal nose; no rhinorrhea; moist mucous membranes; pharynx without lesions noted; no pharyngeal erythema or tonsillar hypertrophy or exudate NECK: Supple, no meningismus, no LAD  CARD: RRR; S1 and S2  appreciated; no murmurs, no clicks, no rubs, no gallops RESP: Normal chest excursion without splinting or tachypnea; breath sounds clear and equal bilaterally; no wheezes, no rhonchi, no rales, no hypoxia or resp distress ABD/GI: Normal bowel sounds; non-distended; soft, non-tender, no rebound, no guarding GU:  No vaginal bleeding, minimal thin clear/yellow vaginal discharge, normal external genitalia, no adnexal tenderness or fullness, no CMT BACK:  The back appears normal and is non-tender to palpation, there is no CVA tenderness EXT: Normal ROM in all joints; non-tender to palpation; no edema; normal capillary refill; no cyanosis; compartments soft, no joint effusions, no erythema or warmth; no bony deformity or tenderness  SKIN: Normal color for age and race; warm; no rash NEURO: Moves all extremities equally; strength 5/5 in all 4 extremities, sensation to light touch intact diffusely, CN 2-12 intact PSYCH: The patient's mood and manner are appropriate. Grooming and personal hygiene are appropriate.  MEDICAL DECISION MAKING: Pt here with multiple complaints with  benign exam.  Will obtain labs, urine, pelvic cultures, head CT given she describes HA as different than migraine.  Neuro intact.  Afebrile and non meningismus.  Will give migraine cocktail with toradol, reglan, benadryl, IVF as this has helped HAs in past.  Doubt meningitis.  Possible viral illness.  ED PROGRESS: Labs unremarkable including CK, CBG.  UA negative for inxn, head CT negative. UPT negative.  Wet prep negative.  Pelvic exam unremarkable.  Tolerating po in ED.  No vomiting.  No improvement in HA with migraine cocktail.  Will give one dose Morphine but I feel she is safe for dc.  Has normal exam, normal labs/urine/imaging, normal vitals.  D/w pt and mother and GF that I recommend outpt PCP follow up. D/w pt return precautions.  They verbalized understanding.  I personally performed the services described in this documentation,  which was scribed in my presence. The recorded information has been reviewed and is accurate.   Corinne, DO 08/12/14 [phone]

## 2014-08-11 NOTE — ED Notes (Signed)
POC urine test is negative.

## 2014-08-11 NOTE — ED Notes (Signed)
MD at bedside. 

## 2014-08-11 NOTE — ED Notes (Signed)
Patient c/o migraine headache with nausea and vomiting and generalized body aches. Patient reports feeling flushed and thirsty. Patient states "I checked my blood pressure, blood sugar, and tempeture at home but it was all okay." Patient denies sensitivity to light or sounds. Per patient feels dizzy.

## 2014-08-13 LAB — GC/CHLAMYDIA PROBE AMP
CT PROBE, AMP APTIMA: NEGATIVE
GC Probe RNA: NEGATIVE

## 2014-08-23 ENCOUNTER — Encounter (HOSPITAL_COMMUNITY): Payer: Self-pay | Admitting: Emergency Medicine

## 2014-08-23 DIAGNOSIS — Z8742 Personal history of other diseases of the female genital tract: Secondary | ICD-10-CM | POA: Insufficient documentation

## 2014-08-23 DIAGNOSIS — Z8709 Personal history of other diseases of the respiratory system: Secondary | ICD-10-CM | POA: Diagnosis not present

## 2014-08-23 DIAGNOSIS — R112 Nausea with vomiting, unspecified: Secondary | ICD-10-CM | POA: Diagnosis present

## 2014-08-23 DIAGNOSIS — Z720 Tobacco use: Secondary | ICD-10-CM | POA: Diagnosis not present

## 2014-08-23 DIAGNOSIS — G8929 Other chronic pain: Secondary | ICD-10-CM | POA: Diagnosis not present

## 2014-08-23 DIAGNOSIS — Z8659 Personal history of other mental and behavioral disorders: Secondary | ICD-10-CM | POA: Diagnosis not present

## 2014-08-23 DIAGNOSIS — Z86018 Personal history of other benign neoplasm: Secondary | ICD-10-CM | POA: Insufficient documentation

## 2014-08-23 DIAGNOSIS — Z79899 Other long term (current) drug therapy: Secondary | ICD-10-CM | POA: Insufficient documentation

## 2014-08-23 NOTE — ED Notes (Signed)
Patient reports was having back pain earlier today when a friend gave her some medication to take for her back pain. Patient states she took medication, which was a disintegrating tablet. States has had nausea and vomiting since taking medication around 1400.

## 2014-08-24 ENCOUNTER — Emergency Department (HOSPITAL_COMMUNITY)
Admission: EM | Admit: 2014-08-24 | Discharge: 2014-08-24 | Disposition: A | Discharge status: Home / Self Care | Payer: Medicaid - Out of State | Attending: Emergency Medicine | Admitting: Emergency Medicine

## 2014-08-24 DIAGNOSIS — R112 Nausea with vomiting, unspecified: Secondary | ICD-10-CM

## 2014-08-24 MED ORDER — ONDANSETRON 8 MG PO TBDP
8.0000 mg | ORAL_TABLET | Freq: Once | ORAL | Status: AC
  Administered 2014-08-24: 8 mg via ORAL
  Filled 2014-08-24: qty 1

## 2014-08-24 NOTE — ED Notes (Signed)
States she's had zofran before and it worked well for her.

## 2014-08-24 NOTE — Discharge Instructions (Signed)

## 2014-08-24 NOTE — ED Provider Notes (Signed)
CSN: [id number]     Arrival date & time 08/23/14  2334 History   This chart was scribed for Sharyon Cable, MD by Chester Holstein, ED Scribe. This patient was seen in room APA03/APA03 and the patient's care was started at 12:40 AM.    Chief Complaint  Patient presents with  . Emesis     Patient is a 24 y.o. female presenting with vomiting. The history is provided by the patient. No language interpreter was used.  Emesis Severity:  Moderate Duration:  2 hours Timing:  Sporadic Quality:  Stomach contents Able to tolerate:  Liquids Progression:  Unchanged Chronicity:  New Exacerbated by: movement. Associated symptoms: no abdominal pain, no diarrhea and no fever   Risk factors comment:  Use of unknown medication pt reports she had episode of worsening chronic back pain during the day on 08/23/14 Her friend gave her a medication - she reports it was small and "dissolved" She did not know what the medication was at the time Several hrs later she vomited No vomiting at this time No other complaints at this time   Past Medical History  Diagnosis Date  . Ovarian cyst   . Benign tumor of adrenal gland   . Chronic pelvic pain in female   . Chronic back pain   . Anxiety   . Bronchitis    Past Surgical History  Procedure Laterality Date  . Oophorectomy Right   . Adrenalectomy     Family History  Problem Relation Age of Onset  . Diabetes Maternal Grandmother   . Heart disease Maternal Grandmother   . Diabetes Maternal Grandfather   . Heart disease Maternal Grandfather    History  Substance Use Topics  . Smoking status: Current Every Day Smoker -- 0.50 packs/day for 10 years    Types: Cigarettes  . Smokeless tobacco: Never Used  . Alcohol Use: No   OB History    Gravida Para Term Preterm AB TAB SAB Ectopic Multiple Living   0 0             Review of Systems  Constitutional: Positive for fatigue. Negative for fever.  Cardiovascular: Negative for chest pain.   Gastrointestinal: Positive for nausea and vomiting. Negative for abdominal pain and diarrhea.  Genitourinary: Negative for dysuria.  Musculoskeletal: Positive for back pain (baseline).  Skin: Negative for rash.  Neurological: Negative for weakness.  All other systems reviewed and are negative.     Allergies  Acetaminophen  Home Medications   Prior to Admission medications   Medication Sig Start Date End Date Taking? Authorizing Provider  albuterol (PROVENTIL HFA;VENTOLIN HFA) 108 (90 BASE) MCG/ACT inhaler Inhale 2 puffs into the lungs every 6 (six) hours as needed for wheezing or shortness of breath.    Historical Provider, MD  butalbital-acetaminophen-caffeine (FIORICET) 50-325-40 MG per tablet Take 1-2 tablets by mouth every 6 (six) hours as needed for headache. 07/31/14 07/31/15  Kristen N Ward, DO  ondansetron (ZOFRAN ODT) 4 MG disintegrating tablet Take 1 tablet (4 mg total) by mouth every 8 (eight) hours as needed for nausea or vomiting. 08/11/14   Kristen N Ward, DO  topiramate (TOPAMAX) 25 MG tablet Take 25-50 mg by mouth 2 (two) times daily. Takes 25 mg in the morning and 50 mg in the evening. 07/11/14   Historical Provider, MD   BP 111/70 mmHg  Pulse 84  Temp(Src) 98.2 F (36.8 C) (Oral)  Resp 16  Ht 5\' 5"  (1.651 m)  Wt 191 lb (  86.637 kg)  BMI 31.78 kg/m2  SpO2 100% Physical Exam  Nursing note and vitals reviewed. CONSTITUTIONAL: Well developed/well nourished HEAD: Normocephalic/atraumatic EYES: EOMI/PERRL ENMT: Mucous membranes moist NECK: supple no meningeal signs CV: S1/S2 noted, no murmurs/rubs/gallops noted LUNGS: Lungs are clear to auscultation bilaterally, no apparent distress ABDOMEN: soft, nontender, no rebound or guarding, bowel sounds noted throughout abdomen NEURO: Pt is awake/alert/appropriate, moves all extremitiesx4.  No facial droop.  No ataxianoted EXTREMITIES: pulses normal/equal, full ROM SKIN: warm, color normal PSYCH: no abnormalities of mood  noted, alert and oriented to situation   ED Course  Procedures DIAGNOSTIC STUDIES: Oxygen Saturation is 100% on room air, normal by my interpretation.    COORDINATION OF CARE: 12:45 AM Discussed treatment plan with patient at beside, the patient agrees with the plan and has no further questions at this time.  pt well appearing, no distress, no vomiting here and it has been several hrs since taking unknown medication I advised against using any medications that are not prescribed for her   Medications  ondansetron (ZOFRAN-ODT) disintegrating tablet 8 mg (8 mg Oral Given 08/24/14 0058)    MDM   Final diagnoses:  Non-intractable vomiting with nausea, vomiting of unspecified type   Nursing notes including past medical history and social history reviewed and considered in documentation    I personally performed the services described in this documentation, which was scribed in my presence. The recorded information has been reviewed and is accurate.      Sharyon Cable, MD 08/24/14 [phone]

## 2014-12-01 ENCOUNTER — Emergency Department (HOSPITAL_COMMUNITY)
Admission: EM | Admit: 2014-12-01 | Discharge: 2014-12-01 | Disposition: A | Discharge status: Home / Self Care | Payer: Medicaid - Out of State | Attending: Emergency Medicine | Admitting: Emergency Medicine

## 2014-12-01 ENCOUNTER — Encounter (HOSPITAL_COMMUNITY): Payer: Self-pay | Admitting: Emergency Medicine

## 2014-12-01 ENCOUNTER — Emergency Department (HOSPITAL_COMMUNITY): Payer: Medicaid - Out of State

## 2014-12-01 DIAGNOSIS — Z86018 Personal history of other benign neoplasm: Secondary | ICD-10-CM | POA: Diagnosis not present

## 2014-12-01 DIAGNOSIS — Z720 Tobacco use: Secondary | ICD-10-CM | POA: Diagnosis not present

## 2014-12-01 DIAGNOSIS — N83202 Unspecified ovarian cyst, left side: Secondary | ICD-10-CM

## 2014-12-01 DIAGNOSIS — N8329 Other ovarian cysts: Secondary | ICD-10-CM | POA: Diagnosis not present

## 2014-12-01 DIAGNOSIS — Z3202 Encounter for pregnancy test, result negative: Secondary | ICD-10-CM | POA: Insufficient documentation

## 2014-12-01 DIAGNOSIS — Z8659 Personal history of other mental and behavioral disorders: Secondary | ICD-10-CM | POA: Insufficient documentation

## 2014-12-01 DIAGNOSIS — G8929 Other chronic pain: Secondary | ICD-10-CM | POA: Diagnosis not present

## 2014-12-01 DIAGNOSIS — R1030 Lower abdominal pain, unspecified: Secondary | ICD-10-CM | POA: Diagnosis present

## 2014-12-01 DIAGNOSIS — Z8709 Personal history of other diseases of the respiratory system: Secondary | ICD-10-CM | POA: Insufficient documentation

## 2014-12-01 LAB — COMPREHENSIVE METABOLIC PANEL
ALBUMIN: 4.1 g/dL (ref 3.5–5.2)
ALK PHOS: 61 U/L (ref 39–117)
ALT: 13 U/L (ref 0–35)
ANION GAP: 8 (ref 5–15)
AST: 17 U/L (ref 0–37)
BILIRUBIN TOTAL: 1.1 mg/dL (ref 0.3–1.2)
BUN: 8 mg/dL (ref 6–23)
CO2: 24 mmol/L (ref 19–32)
Calcium: 9.3 mg/dL (ref 8.4–10.5)
Chloride: 106 mmol/L (ref 96–112)
Creatinine, Ser: 0.86 mg/dL (ref 0.50–1.10)
GFR calc Af Amer: >90 mL/min (ref >90)
GFR calc non Af Amer: >90 mL/min (ref >90)
Glucose, Bld: 85 mg/dL (ref 70–99)
POTASSIUM: 4.2 mmol/L (ref 3.5–5.1)
Sodium: 138 mmol/L (ref 135–145)
Total Protein: 7.9 g/dL (ref 6.0–8.3)

## 2014-12-01 LAB — CBC WITH DIFFERENTIAL/PLATELET
Basophils Absolute: 0 10*3/uL (ref 0.0–0.1)
Basophils Relative: 0 % (ref 0–1)
Eosinophils Absolute: 0.3 10*3/uL (ref 0.0–0.7)
Eosinophils Relative: 3 % (ref 0–5)
HEMATOCRIT: 48.8 % — AB (ref 36.0–46.0)
Hemoglobin: 16 g/dL — ABNORMAL HIGH (ref 12.0–15.0)
LYMPHS ABS: 2.1 10*3/uL (ref 0.7–4.0)
LYMPHS PCT: 22 % (ref 12–46)
MCH: 31.1 pg (ref 26.0–34.0)
MCHC: 32.8 g/dL (ref 30.0–36.0)
MCV: 94.9 fL (ref 78.0–100.0)
Monocytes Absolute: 0.8 10*3/uL (ref 0.1–1.0)
Monocytes Relative: 8 % (ref 3–12)
Neutro Abs: 6.5 10*3/uL (ref 1.7–7.7)
Neutrophils Relative %: 67 % (ref 43–77)
PLATELETS: 311 10*3/uL (ref 150–400)
RBC: 5.14 MIL/uL — ABNORMAL HIGH (ref 3.87–5.11)
RDW: 13.3 % (ref 11.5–15.5)
WBC: 9.7 10*3/uL (ref 4.0–10.5)

## 2014-12-01 LAB — WET PREP, GENITAL
Clue Cells Wet Prep HPF POC: NONE SEEN
TRICH WET PREP: NONE SEEN
Yeast Wet Prep HPF POC: NONE SEEN

## 2014-12-01 LAB — URINALYSIS, ROUTINE W REFLEX MICROSCOPIC
Bilirubin Urine: NEGATIVE
Glucose, UA: NEGATIVE mg/dL
KETONES UR: NEGATIVE mg/dL
LEUKOCYTES UA: NEGATIVE
Nitrite: NEGATIVE
PROTEIN: NEGATIVE mg/dL
UROBILINOGEN UA: 0.2 mg/dL (ref 0.0–1.0)
pH: 5.5 (ref 5.0–8.0)

## 2014-12-01 LAB — URINE MICROSCOPIC-ADD ON

## 2014-12-01 LAB — PREGNANCY, URINE: Preg Test, Ur: NEGATIVE

## 2014-12-01 MED ORDER — MORPHINE SULFATE 4 MG/ML IJ SOLN
8.0000 mg | Freq: Once | INTRAMUSCULAR | Status: AC
  Administered 2014-12-01: 8 mg via INTRAVENOUS
  Filled 2014-12-01: qty 2

## 2014-12-01 MED ORDER — OXYCODONE HCL 5 MG PO TABS: 5.0000 mg | ORAL_TABLET | Freq: Four times a day (QID) | ORAL | Status: DC | PRN

## 2014-12-01 NOTE — ED Notes (Signed)
Patient c/o left lower abd pain with numbness and tingling to lower back. Per patient started having vaginal bleeding last night in which she states "I shouldn't be bleeding I get the Lupron injections that prevent me from having my period." Per patient was placed on injections because of constant vaginal bleeding x8 months. Patient reports taking ibuprofen 800mg  and tylenol extra strength with no relief. Nausea reported, denies any vomiting or diarrhea.

## 2014-12-01 NOTE — Discharge Instructions (Signed)
Abdominal Pain, Women °Abdominal (stomach, pelvic, or belly) pain can be caused by many things. It is important to tell your doctor: °· The location of the pain. °· Does it come and go or is it present all the time? °· Are there things that start the pain (eating certain foods, exercise)? °· Are there other symptoms associated with the pain (fever, nausea, vomiting, diarrhea)? °All of this is helpful to know when trying to find the cause of the pain. °CAUSES  °· Stomach: virus or bacteria infection, or ulcer. °· Intestine: appendicitis (inflamed appendix), regional ileitis (Crohn's disease), ulcerative colitis (inflamed colon), irritable bowel syndrome, diverticulitis (inflamed diverticulum of the colon), or cancer of the stomach or intestine. °· Gallbladder disease or stones in the gallbladder. °· Kidney disease, kidney stones, or infection. °· Pancreas infection or cancer. °· Fibromyalgia (pain disorder). °· Diseases of the female organs: °· Uterus: fibroid (non-cancerous) tumors or infection. °· Fallopian tubes: infection or tubal pregnancy. °· Ovary: cysts or tumors. °· Pelvic adhesions (scar tissue). °· Endometriosis (uterus lining tissue growing in the pelvis and on the pelvic organs). °· Pelvic congestion syndrome (female organs filling up with blood just before the menstrual period). °· Pain with the menstrual period. °· Pain with ovulation (producing an egg). °· Pain with an IUD (intrauterine device, birth control) in the uterus. °· Cancer of the female organs. °· Functional pain (pain not caused by a disease, may improve without treatment). °· Psychological pain. °· Depression. °DIAGNOSIS  °Your doctor will decide the seriousness of your pain by doing an examination. °· Blood tests. °· X-rays. °· Ultrasound. °· CT scan (computed tomography, special type of X-ray). °· MRI (magnetic resonance imaging). °· Cultures, for infection. °· Barium enema (dye inserted in the large intestine, to better view it with  X-rays). °· Colonoscopy (looking in intestine with a lighted tube). °· Laparoscopy (minor surgery, looking in abdomen with a lighted tube). °· Major abdominal exploratory surgery (looking in abdomen with a large incision). °TREATMENT  °The treatment will depend on the cause of the pain.  °· Many cases can be observed and treated at home. °· Over-the-counter medicines recommended by your caregiver. °· Prescription medicine. °· Antibiotics, for infection. °· Birth control pills, for painful periods or for ovulation pain. °· Hormone treatment, for endometriosis. °· Nerve blocking injections. °· Physical therapy. °· Antidepressants. °· Counseling with a psychologist or psychiatrist. °· Minor or major surgery. °HOME CARE INSTRUCTIONS  °· Do not take laxatives, unless directed by your caregiver. °· Take over-the-counter pain medicine only if ordered by your caregiver. Do not take aspirin because it can cause an upset stomach or bleeding. °· Try a clear liquid diet (broth or water) as ordered by your caregiver. Slowly move to a bland diet, as tolerated, if the pain is related to the stomach or intestine. °· Have a thermometer and take your temperature several times a day, and record it. °· Bed rest and sleep, if it helps the pain. °· Avoid sexual intercourse, if it causes pain. °· Avoid stressful situations. °· Keep your follow-up appointments and tests, as your caregiver orders. °· If the pain does not go away with medicine or surgery, you may try: °· Acupuncture. °· Relaxation exercises (yoga, meditation). °· Group therapy. °· Counseling. °SEEK MEDICAL CARE IF:  °· You notice certain foods cause stomach pain. °· Your home care treatment is not helping your pain. °· You need stronger pain medicine. °· You want your IUD removed. °· You feel faint or   not go away with medicine or surgery, you may try:   Acupuncture.   Relaxation exercises (yoga, meditation).   Group therapy.   Counseling.  SEEK MEDICAL CARE IF:    You notice certain foods cause stomach pain.   Your home care treatment is not helping your pain.   You need stronger pain medicine.   You want your IUD removed.   You feel faint or lightheaded.   You develop nausea and vomiting.   You develop a rash.   You are having side effects or an allergy to your medicine.  SEEK IMMEDIATE MEDICAL CARE IF:    Your  pain does not go away or gets worse.   You have a fever.   Your pain is felt only in portions of the abdomen. The right side could possibly be appendicitis. The left lower portion of the abdomen could be colitis or diverticulitis.   You are passing blood in your stools (bright red or black tarry stools, with or without vomiting).   You have blood in your urine.   You develop chills, with or without a fever.   You pass out.  MAKE SURE YOU:    Understand these instructions.   Will watch your condition.   Will get help right away if you are not doing well or get worse.  Document Released: 06/06/2007 Document Revised: 12/24/2013 Document Reviewed: 06/26/2009  Exit[employer] Patient Information 2015 [employer]. This information is not intended to replace advice given to you by your health care provider. Make sure you discuss any questions you have with your health care provider.          Ovarian Cyst  An ovarian cyst is a fluid-filled sac that forms on an ovary. The ovaries are small organs that produce eggs in women. Various types of cysts can form on the ovaries. Most are not cancerous. Many do not cause problems, and they often go away on their own. Some may cause symptoms and require treatment. Common types of ovarian cysts include:   Functional cysts--These cysts may occur every month during the menstrual cycle. This is normal. The cysts usually go away with the next menstrual cycle if the woman does not get pregnant. Usually, there are no symptoms with a functional cyst.   Endometrioma cysts--These cysts form from the tissue that lines the uterus. They are also called "chocolate cysts" because they become filled with blood that turns brown. This type of cyst can cause pain in the lower abdomen during intercourse and with your menstrual period.   Cystadenoma cysts--This type develops from the cells on the outside of the ovary. These cysts can get very big and cause lower abdomen pain and pain with  intercourse. This type of cyst can twist on itself, cut off its blood supply, and cause severe pain. It can also easily rupture and cause a lot of pain.   Dermoid cysts--This type of cyst is sometimes found in both ovaries. These cysts may contain different kinds of body tissue, such as skin, teeth, hair, or cartilage. They usually do not cause symptoms unless they get very big.   Theca lutein cysts--These cysts occur when too much of a certain hormone (human chorionic gonadotropin) is produced and overstimulates the ovaries to produce an egg. This is most common after procedures used to assist with the conception of a baby (in vitro fertilization).  CAUSES    Fertility drugs can cause a condition in which multiple large cysts are formed on the   ovaries. This is called ovarian hyperstimulation syndrome.   A condition called polycystic ovary syndrome can cause hormonal imbalances that can lead to nonfunctional ovarian cysts.  SIGNS AND SYMPTOMS   Many ovarian cysts do not cause symptoms. If symptoms are present, they may include:   Pelvic pain or pressure.   Pain in the lower abdomen.   Pain during sexual intercourse.   Increasing girth (swelling) of the abdomen.   Abnormal menstrual periods.   Increasing pain with menstrual periods.   Stopping having menstrual periods without being pregnant.  DIAGNOSIS   These cysts are commonly found during a routine or annual pelvic exam. Tests may be ordered to find out more about the cyst. These tests may include:   Ultrasound.   X-ray of the pelvis.   CT scan.   MRI.   Blood tests.  TREATMENT   Many ovarian cysts go away on their own without treatment. Your health care provider may want to check your cyst regularly for 2-3 months to see if it changes. For women in menopause, it is particularly important to monitor a cyst closely because of the higher rate of ovarian cancer in menopausal women. When treatment is needed, it may include any of the following:   A  procedure to drain the cyst (aspiration). This may be done using a long needle and ultrasound. It can also be done through a laparoscopic procedure. This involves using a thin, lighted tube with a tiny camera on the end (laparoscope) inserted through a small incision.   Surgery to remove the whole cyst. This may be done using laparoscopic surgery or an open surgery involving a larger incision in the lower abdomen.   Hormone treatment or birth control pills. These methods are sometimes used to help dissolve a cyst.  HOME CARE INSTRUCTIONS    Only take over-the-counter or prescription medicines as directed by your health care provider.   Follow up with your health care provider as directed.   Get regular pelvic exams and Pap tests.  SEEK MEDICAL CARE IF:    Your periods are late, irregular, or painful, or they stop.   Your pelvic pain or abdominal pain does not go away.   Your abdomen becomes larger or swollen.   You have pressure on your bladder or trouble emptying your bladder completely.   You have pain during sexual intercourse.   You have feelings of fullness, pressure, or discomfort in your stomach.   You lose weight for no apparent reason.   You feel generally ill.   You become constipated.   You lose your appetite.   You develop acne.   You have an increase in body and facial hair.   You are gaining weight, without changing your exercise and eating habits.   You think you are pregnant.  SEEK IMMEDIATE MEDICAL CARE IF:    You have increasing abdominal pain.   You feel sick to your stomach (nauseous), and you throw up (vomit).   You develop a fever that comes on suddenly.   You have abdominal pain during a bowel movement.   Your menstrual periods become heavier than usual.  MAKE SURE YOU:   Understand these instructions.   Will watch your condition.   Will get help right away if you are not doing well or get worse.  Document Released: 08/09/2005 Document Revised: 08/14/2013 Document  Reviewed: 04/16/2013  [employer] Patient Information 2015 [employer]. This information is not intended to replace advice given to you by

## 2014-12-01 NOTE — ED Provider Notes (Signed)
CSN: [id number]     Arrival date & time 12/01/14  1532 History   First MD Initiated Contact with Patient 12/01/14 1719     Chief Complaint  Patient presents with  . Abdominal Pain     (Consider location/radiation/quality/duration/timing/severity/associated sxs/prior Treatment) HPI  24 year old female presents with acute lower abdominal pain since yesterday. She states she started spotting for the first time in several months. She is currently not having menstrual cycles because she is on Lupron injection. The patient states that now she is having stronger vaginal bleeding as well as sharp lower abdominal pain in the middle and left side. She's had her right ovary removed in the past. She states she's been told she has large ovarian cyst on the left. The patient denies any fevers or chills. No nausea, vomiting, or urinary symptoms. She also states the pain is radiating straight through to her back and she's been having pain and numbness on the left side of her back. Occasionally has tingling in her lower extremities. No trouble ambulating or weakness in her legs. No incontinence. Took ibuprofen without relief. Also took a Vicodin from her mom. Rates her pain as severe. The pain is so bad it is causing her a headache.  Past Medical History  Diagnosis Date  . Ovarian cyst   . Benign tumor of adrenal gland   . Chronic pelvic pain in female   . Chronic back pain   . Anxiety   . Bronchitis    Past Surgical History  Procedure Laterality Date  . Oophorectomy Right   . Adrenalectomy     Family History  Problem Relation Age of Onset  . Diabetes Maternal Grandmother   . Heart disease Maternal Grandmother   . Diabetes Maternal Grandfather   . Heart disease Maternal Grandfather    History  Substance Use Topics  . Smoking status: Current Every Day Smoker -- 0.50 packs/day for 10 years    Types: Cigarettes  . Smokeless tobacco: Never Used  . Alcohol Use: No   OB History    Gravida Para  Term Preterm AB TAB SAB Ectopic Multiple Living   0 0        0     Review of Systems  Constitutional: Negative for fever.  Gastrointestinal: Positive for abdominal pain. Negative for nausea and vomiting.  Genitourinary: Positive for vaginal bleeding and vaginal discharge. Negative for dysuria and hematuria.  Musculoskeletal: Positive for back pain.  All other systems reviewed and are negative.     Allergies  Acetaminophen  Home Medications   Prior to Admission medications   Medication Sig Start Date End Date Taking? Authorizing Provider  ibuprofen (ADVIL,MOTRIN) 800 MG tablet Take 800 mg by mouth every 8 (eight) hours as needed for mild pain.   Yes Historical Provider, MD  PRESCRIPTION MEDICATION Apply 1 application topically as needed (Rash).   Yes Historical Provider, MD  butalbital-acetaminophen-caffeine (FIORICET) 50-325-40 MG per tablet Take 1-2 tablets by mouth every 6 (six) hours as needed for headache. Patient not taking: Reported on 12/01/2014 07/31/14 07/31/15  Kristen N Ward, DO  ondansetron (ZOFRAN ODT) 4 MG disintegrating tablet Take 1 tablet (4 mg total) by mouth every 8 (eight) hours as needed for nausea or vomiting. Patient not taking: Reported on 12/01/2014 08/11/14   Kristen N Ward, DO   BP 141/84 mmHg  Pulse 94  Temp(Src) 98.3 F (36.8 C) (Oral)  Resp 18  Ht 5\' 5"  (1.651 m)  Wt 180 lb (81.647 kg)  BMI  29.95 kg/m2  SpO2 100% Physical Exam  Constitutional: She is oriented to person, place, and time. She appears well-developed and well-nourished.  HENT:  Head: Normocephalic and atraumatic.  Right Ear: External ear normal.  Left Ear: External ear normal.  Nose: Nose normal.  Eyes: Right eye exhibits no discharge. Left eye exhibits no discharge.  Cardiovascular: Normal rate, regular rhythm and normal heart sounds.   Pulmonary/Chest: Effort normal and breath sounds normal.  Abdominal: Soft. There is tenderness in the suprapubic area and left lower quadrant.    Genitourinary: Uterus is not enlarged. Cervix exhibits no discharge. Right adnexum displays no mass. Left adnexum displays no mass. There is bleeding in the vagina. No signs of injury around the vagina. No vaginal discharge found.  Musculoskeletal:       Lumbar back: She exhibits tenderness.       Back:  Neurological: She is alert and oriented to person, place, and time. She displays no Babinski's sign on the right side. She displays no Babinski's sign on the left side.  Reflex Scores:      Patellar reflexes are 2+ on the right side and 2+ on the left side.      Achilles reflexes are 2+ on the right side and 2+ on the left side. Equal strength and normal sensation in lower extremities.  Skin: Skin is warm and dry.  Nursing note and vitals reviewed.   ED Course  Procedures (including critical care time) Labs Review Labs Reviewed  WET PREP, GENITAL - Abnormal; Notable for the following:    WBC, Wet Prep HPF POC FEW (*)    All other components within normal limits  CBC WITH DIFFERENTIAL/PLATELET - Abnormal; Notable for the following:    RBC 5.14 (*)    Hemoglobin 16.0 (*)    HCT 48.8 (*)    All other components within normal limits  URINALYSIS, ROUTINE W REFLEX MICROSCOPIC - Abnormal; Notable for the following:    Specific Gravity, Urine >1.030 (*)    Hgb urine dipstick SMALL (*)    All other components within normal limits  URINE MICROSCOPIC-ADD ON - Abnormal; Notable for the following:    Bacteria, UA FEW (*)    All other components within normal limits  COMPREHENSIVE METABOLIC PANEL  PREGNANCY, URINE  GC/CHLAMYDIA PROBE AMP ([facility])    Imaging Review US Transvaginal Non-ob  12/01/2014   CLINICAL DATA:  Lower abdominal pain for 1 day. Pelvic pain. Chronic pelvic pain. RIGHT ovarian resection.  EXAM: TRANSABDOMINAL AND TRANSVAGINAL ULTRASOUND OF PELVIS  DOPPLER ULTRASOUND OF OVARIES  TECHNIQUE: Both transabdominal and transvaginal ultrasound examinations of the pelvis  were performed. Transabdominal technique was performed for global imaging of the pelvis including uterus, ovaries, adnexal regions, and pelvic cul-de-sac.  It was necessary to proceed with endovaginal exam following the transabdominal exam to visualize the uterus, endometrium and both ovaries. Color and duplex Doppler ultrasound was utilized to evaluate blood flow to the ovaries.  COMPARISON:  11/20/2013.  FINDINGS: Uterus  Measurements: 68 mm x 41 mm x 52 mm. Normal myometrial echotexture. No fibroids or other mass visualized.  Endometrium  Thickness: 6.8 mm.  No focal abnormality visualized.  Right ovary  Surgically absent.  Left ovary  Measurements: 25 mm x 12 mm x 28 mm. The ovary appears within normal limits. In the LEFT adnexal region, there is a hypoechoic multi septated lesion that measures 54 mm x 33 mm x 47 mm. This likely represents a hemorrhagic cyst. Complex para ovarian is  in the differential considerations. Endometrioma unlikely based on the septations. Reviewing the cinematic sweep images, this actually appears intimately associated with the ovary and likely arises from the ovary rather than the adnexal structures.  Pulsed Doppler evaluation of both ovaries demonstrates normal low-resistance arterial and venous waveforms.  Other findings  Small amount of free fluid in the LEFT adnexal region may be loculated.  IMPRESSION: 1. Negative for LEFT ovarian torsion. 2. Greater than 5 cm long axis multi septated LEFT adnexal cystic lesion likely represents hemorrhagic cyst. 6-12 week follow-up ultrasound recommended to ensure resolution. This recommendation follows the consensus statement: Management of Asymptomatic Ovarian and Other Adnexal Cysts Imaged at [place]: Society of Radiologists in [place]. Radiology 2010; [phone].   Electronically Signed   By: Dereck Ligas M.D.   On: 12/01/2014 20:39   US Pelvis Complete  12/01/2014   CLINICAL DATA:  Lower abdominal pain for  1 day. Pelvic pain. Chronic pelvic pain. RIGHT ovarian resection.  EXAM: TRANSABDOMINAL AND TRANSVAGINAL ULTRASOUND OF PELVIS  DOPPLER ULTRASOUND OF OVARIES  TECHNIQUE: Both transabdominal and transvaginal ultrasound examinations of the pelvis were performed. Transabdominal technique was performed for global imaging of the pelvis including uterus, ovaries, adnexal regions, and pelvic cul-de-sac.  It was necessary to proceed with endovaginal exam following the transabdominal exam to visualize the uterus, endometrium and both ovaries. Color and duplex Doppler ultrasound was utilized to evaluate blood flow to the ovaries.  COMPARISON:  11/20/2013.  FINDINGS: Uterus  Measurements: 68 mm x 41 mm x 52 mm. Normal myometrial echotexture. No fibroids or other mass visualized.  Endometrium  Thickness: 6.8 mm.  No focal abnormality visualized.  Right ovary  Surgically absent.  Left ovary  Measurements: 25 mm x 12 mm x 28 mm. The ovary appears within normal limits. In the LEFT adnexal region, there is a hypoechoic multi septated lesion that measures 54 mm x 33 mm x 47 mm. This likely represents a hemorrhagic cyst. Complex para ovarian is in the differential considerations. Endometrioma unlikely based on the septations. Reviewing the cinematic sweep images, this actually appears intimately associated with the ovary and likely arises from the ovary rather than the adnexal structures.  Pulsed Doppler evaluation of both ovaries demonstrates normal low-resistance arterial and venous waveforms.  Other findings  Small amount of free fluid in the LEFT adnexal region may be loculated.  IMPRESSION: 1. Negative for LEFT ovarian torsion. 2. Greater than 5 cm long axis multi septated LEFT adnexal cystic lesion likely represents hemorrhagic cyst. 6-12 week follow-up ultrasound recommended to ensure resolution. This recommendation follows the consensus statement: Management of Asymptomatic Ovarian and Other Adnexal Cysts Imaged at [place]: Society  of Radiologists in [place]. Radiology 2010; [phone].   Electronically Signed   By: Dereck Ligas M.D.   On: 12/01/2014 20:39   [place] Art/ven Flow Abd Pelv Doppler Limited  12/01/2014   CLINICAL DATA:  Lower abdominal pain for 1 day. Pelvic pain. Chronic pelvic pain. RIGHT ovarian resection.  EXAM: TRANSABDOMINAL AND TRANSVAGINAL ULTRASOUND OF PELVIS  DOPPLER ULTRASOUND OF OVARIES  TECHNIQUE: Both transabdominal and transvaginal ultrasound examinations of the pelvis were performed. Transabdominal technique was performed for global imaging of the pelvis including uterus, ovaries, adnexal regions, and pelvic cul-de-sac.  It was necessary to proceed with endovaginal exam following the transabdominal exam to visualize the uterus, endometrium and both ovaries. Color and duplex Doppler ultrasound was utilized to evaluate blood flow to the ovaries.  COMPARISON:  11/20/2013.  FINDINGS:  Uterus  Measurements: 68 mm x 41 mm x 52 mm. Normal myometrial echotexture. No fibroids or other mass visualized.  Endometrium  Thickness: 6.8 mm.  No focal abnormality visualized.  Right ovary  Surgically absent.  Left ovary  Measurements: 25 mm x 12 mm x 28 mm. The ovary appears within normal limits. In the LEFT adnexal region, there is a hypoechoic multi septated lesion that measures 54 mm x 33 mm x 47 mm. This likely represents a hemorrhagic cyst. Complex para ovarian is in the differential considerations. Endometrioma unlikely based on the septations. Reviewing the cinematic sweep images, this actually appears intimately associated with the ovary and likely arises from the ovary rather than the adnexal structures.  Pulsed Doppler evaluation of both ovaries demonstrates normal low-resistance arterial and venous waveforms.  Other findings  Small amount of free fluid in the LEFT adnexal region may be loculated.  IMPRESSION: 1. Negative for LEFT ovarian torsion. 2. Greater than 5 cm long axis multi  septated LEFT adnexal cystic lesion likely represents hemorrhagic cyst. 6-12 week follow-up ultrasound recommended to ensure resolution. This recommendation follows the consensus statement: Management of Asymptomatic Ovarian and Other Adnexal Cysts Imaged at [place]: Society of Radiologists in [place]. Radiology 2010; [phone].   Electronically Signed   By: Dereck Ligas M.D.   On: 12/01/2014 20:39     EKG Interpretation None      MDM   Final diagnoses:  Lower abdominal pain  Left ovarian cyst    Patient's left lower quadrant abdominal pain appears to be coming from the hemorrhagic ovarian cyst seen on ultrasound. No evidence of torsion. Doubt other acute pathology such as kidney stone, diverticulitis, or obstruction. Feels better with IV pain medicine. No signs of bacterial infection. Will treat with oral pain medicine and recommend follow-up with OB/GYN.    Sherwood Gambler, MD 12/01/14 2145

## 2014-12-02 LAB — GC/CHLAMYDIA PROBE AMP (~~LOC~~) NOT AT ARMC
Chlamydia: NEGATIVE
Neisseria Gonorrhea: NEGATIVE

## 2015-02-26 ENCOUNTER — Emergency Department (HOSPITAL_COMMUNITY)
Admission: EM | Admit: 2015-02-26 | Discharge: 2015-02-26 | Disposition: A | Discharge status: Home / Self Care | Payer: Medicaid - Out of State | Attending: Emergency Medicine | Admitting: Emergency Medicine

## 2015-02-26 ENCOUNTER — Emergency Department (HOSPITAL_COMMUNITY): Payer: Medicaid - Out of State

## 2015-02-26 ENCOUNTER — Encounter (HOSPITAL_COMMUNITY): Payer: Self-pay | Admitting: Emergency Medicine

## 2015-02-26 DIAGNOSIS — Z720 Tobacco use: Secondary | ICD-10-CM | POA: Insufficient documentation

## 2015-02-26 DIAGNOSIS — Z8659 Personal history of other mental and behavioral disorders: Secondary | ICD-10-CM | POA: Diagnosis not present

## 2015-02-26 DIAGNOSIS — S3992XA Unspecified injury of lower back, initial encounter: Secondary | ICD-10-CM | POA: Diagnosis present

## 2015-02-26 DIAGNOSIS — Z86018 Personal history of other benign neoplasm: Secondary | ICD-10-CM | POA: Diagnosis not present

## 2015-02-26 DIAGNOSIS — Z3202 Encounter for pregnancy test, result negative: Secondary | ICD-10-CM | POA: Diagnosis not present

## 2015-02-26 DIAGNOSIS — M545 Low back pain, unspecified: Secondary | ICD-10-CM

## 2015-02-26 DIAGNOSIS — Y9389 Activity, other specified: Secondary | ICD-10-CM | POA: Diagnosis not present

## 2015-02-26 DIAGNOSIS — M542 Cervicalgia: Secondary | ICD-10-CM

## 2015-02-26 DIAGNOSIS — Y998 Other external cause status: Secondary | ICD-10-CM | POA: Diagnosis not present

## 2015-02-26 DIAGNOSIS — G8929 Other chronic pain: Secondary | ICD-10-CM | POA: Diagnosis not present

## 2015-02-26 DIAGNOSIS — Z8742 Personal history of other diseases of the female genital tract: Secondary | ICD-10-CM | POA: Insufficient documentation

## 2015-02-26 DIAGNOSIS — Y92410 Unspecified street and highway as the place of occurrence of the external cause: Secondary | ICD-10-CM | POA: Diagnosis not present

## 2015-02-26 DIAGNOSIS — Z8709 Personal history of other diseases of the respiratory system: Secondary | ICD-10-CM | POA: Insufficient documentation

## 2015-02-26 DIAGNOSIS — M549 Dorsalgia, unspecified: Secondary | ICD-10-CM

## 2015-02-26 DIAGNOSIS — S199XXA Unspecified injury of neck, initial encounter: Secondary | ICD-10-CM | POA: Insufficient documentation

## 2015-02-26 LAB — PREGNANCY, URINE: Preg Test, Ur: NEGATIVE

## 2015-02-26 LAB — URINALYSIS, ROUTINE W REFLEX MICROSCOPIC
Bilirubin Urine: NEGATIVE
GLUCOSE, UA: NEGATIVE mg/dL
Hgb urine dipstick: NEGATIVE
Ketones, ur: NEGATIVE mg/dL
LEUKOCYTES UA: NEGATIVE
Nitrite: NEGATIVE
PROTEIN: NEGATIVE mg/dL
Specific Gravity, Urine: 1.015 (ref 1.005–1.030)
UROBILINOGEN UA: 0.2 mg/dL (ref 0.0–1.0)
pH: 8 (ref 5.0–8.0)

## 2015-02-26 MED ORDER — NAPROXEN 500 MG PO TABS
ORAL_TABLET | ORAL | Status: DC
Start: 2015-02-26 — End: 2016-02-06

## 2015-02-26 MED ORDER — CYCLOBENZAPRINE HCL 5 MG PO TABS: 5.0000 mg | ORAL_TABLET | Freq: Three times a day (TID) | ORAL | Status: DC | PRN

## 2015-02-26 MED ORDER — KETOROLAC TROMETHAMINE 60 MG/2ML IM SOLN
60.0000 mg | Freq: Once | INTRAMUSCULAR | Status: AC
  Administered 2015-02-26: 60 mg via INTRAMUSCULAR
  Filled 2015-02-26: qty 2
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
## 2015-02-26 NOTE — Discharge Instructions (Signed)
Ice packs to the injured or sore muscles for the next several days then start using heat. Take the medications for pain and muscle spasms. Return to the ED for any problems listed on the head injury sheet. Recheck if you aren't improving in the next week by your primary care doctor.
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
## 2015-02-26 NOTE — ED Notes (Signed)
Patients ear rings X8, lip ring X1, tongue ringX1 removed and placed in cup with 1 yellow necklace and 1 yellow charm for patients xray

## 2015-06-05 IMAGING — US US TRANSVAGINAL NON-OB
1 series · 13 of 25 positions shown · non-contrast
Comparison: 10/16/2013

CLINICAL DATA: Pain.  Evaluate for torsion.

EXAM:
TRANSABDOMINAL AND TRANSVAGINAL ULTRASOUND OF PELVIS
DOPPLER ULTRASOUND OF OVARIES
TECHNIQUE: Both transabdominal and transvaginal ultrasound examinations of the
pelvis were performed. Transabdominal technique was performed for
global imaging of the pelvis including uterus, ovaries, adnexal
regions, and pelvic cul-de-sac.
It was necessary to proceed with endovaginal exam following the
transabdominal exam to visualize the ovaries and adnexa. Color and
duplex Doppler ultrasound was utilized to evaluate blood flow to the
ovaries.

[Series 1: us transvaginal non-ob · 0.18mm/px · 13 of 90 slices shown]
[im 1/90]
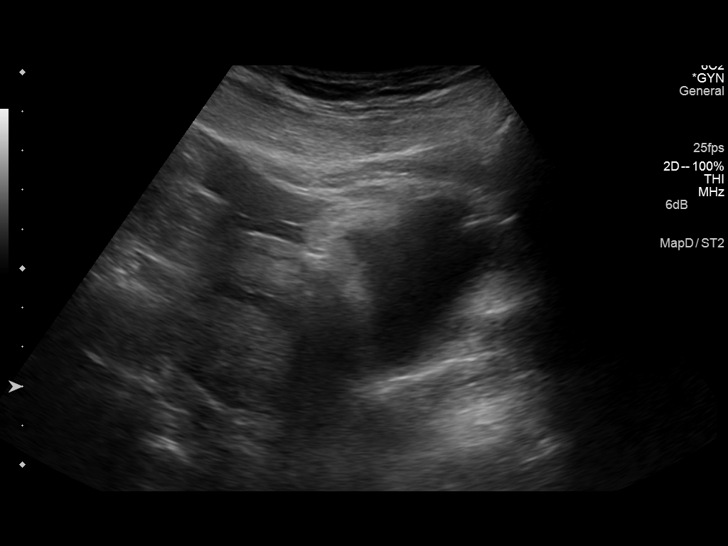
[im 8/90]
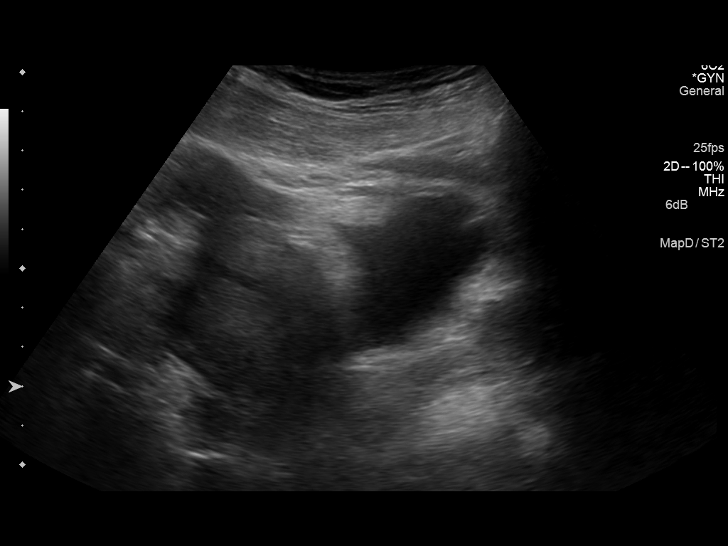
[im 15/90]
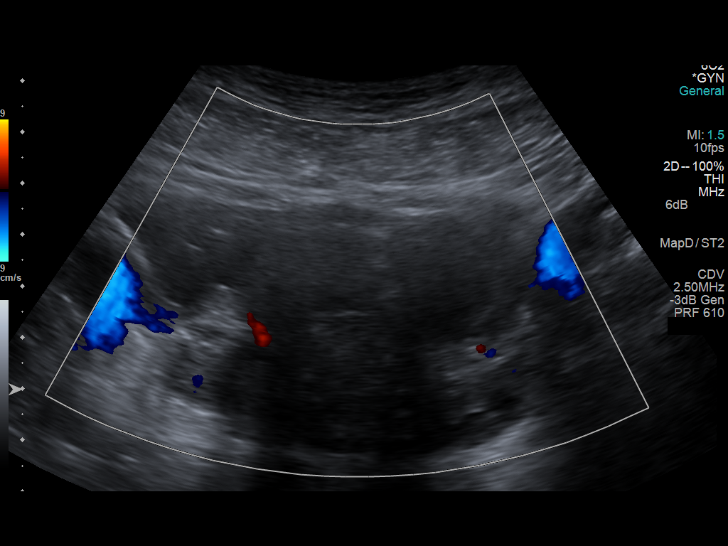
[im 23/90]
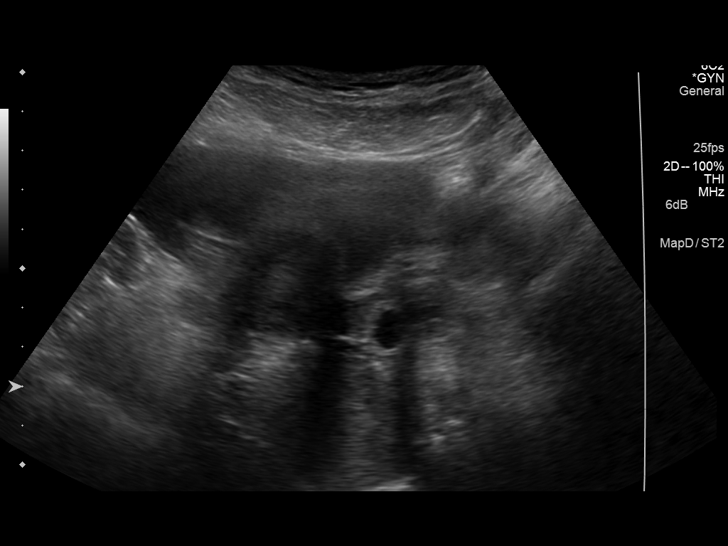
[im 30/90]
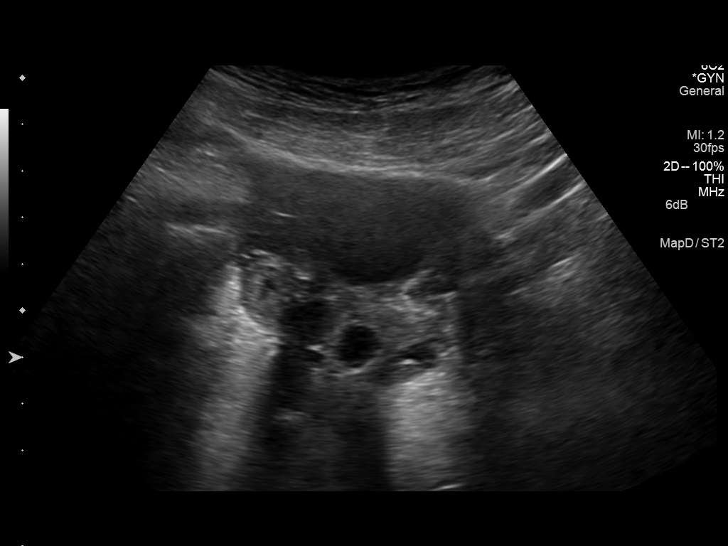
[im 38/90]
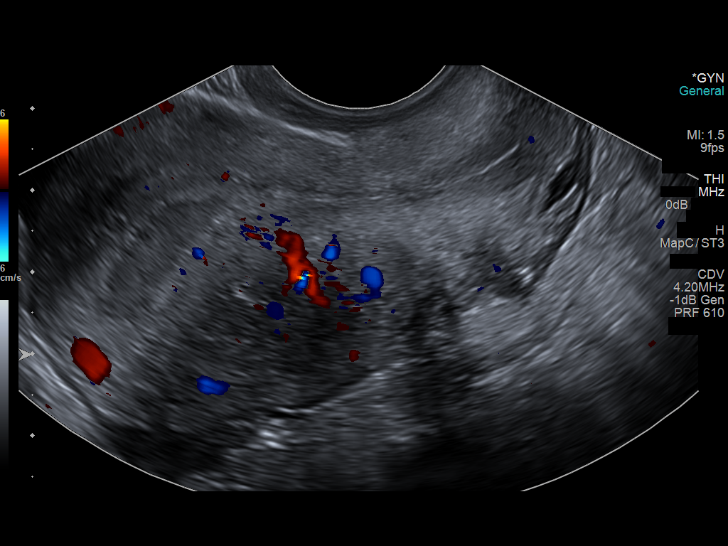
[im 45/90]
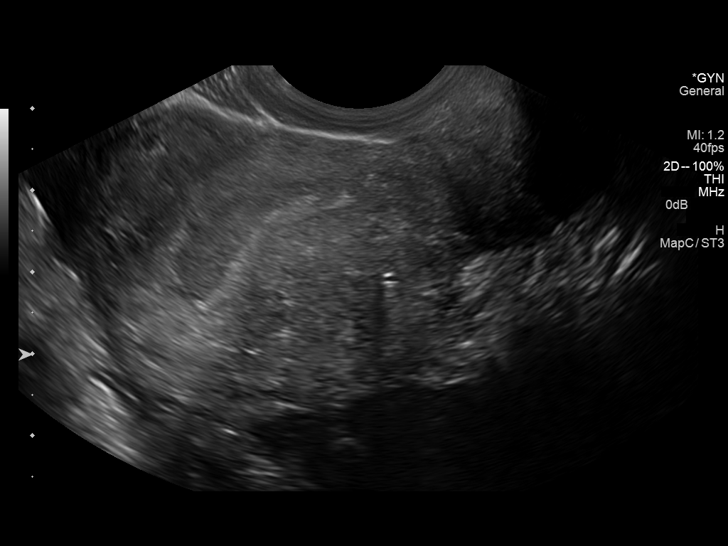
[im 52/90]
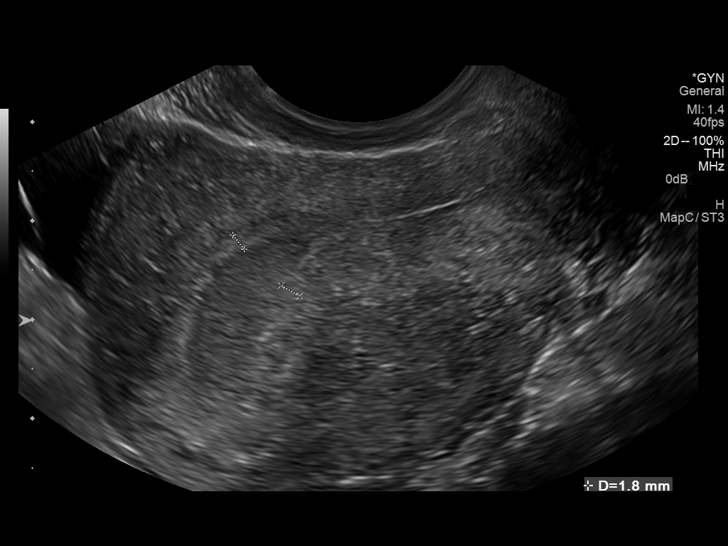
[im 60/90]
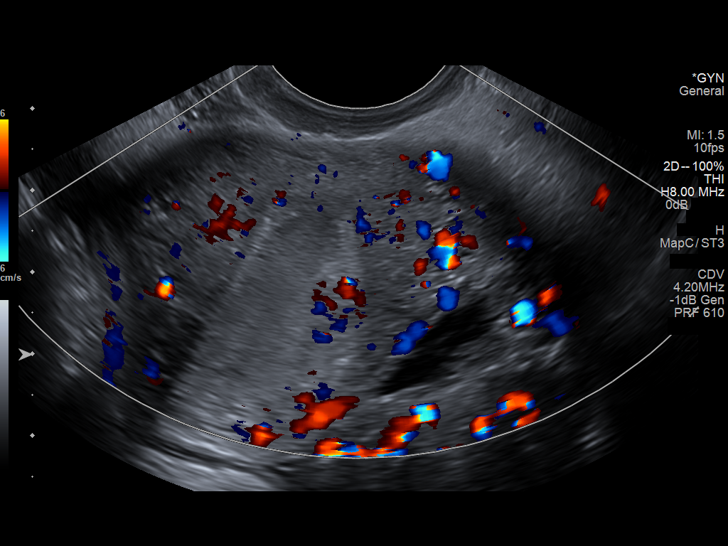
[im 67/90]
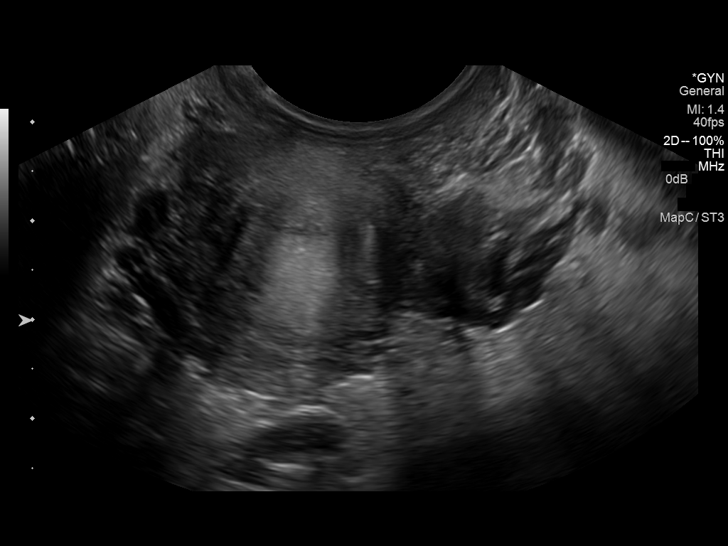
[im 75/90]
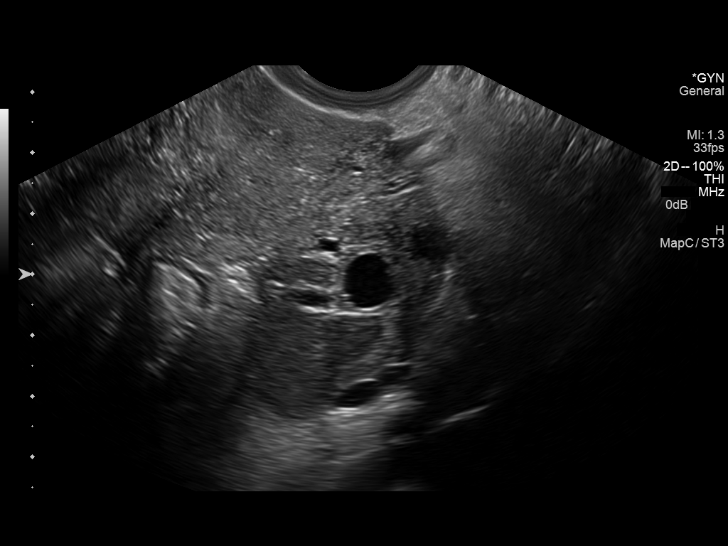
[im 82/90]
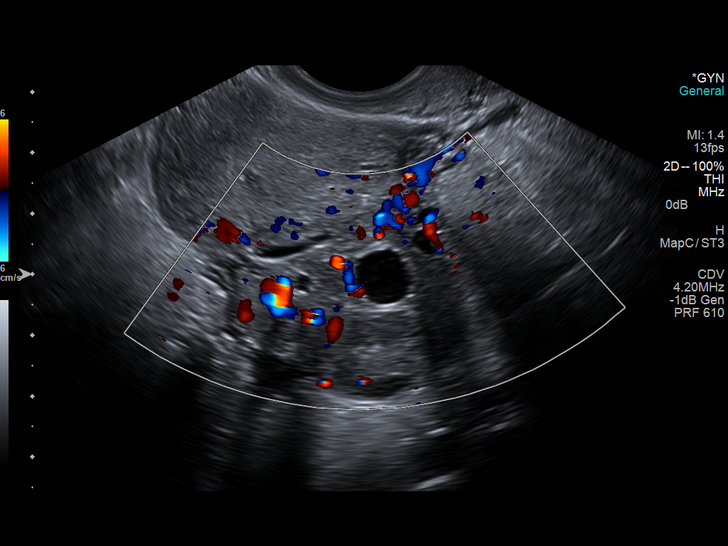
[im 90/90]
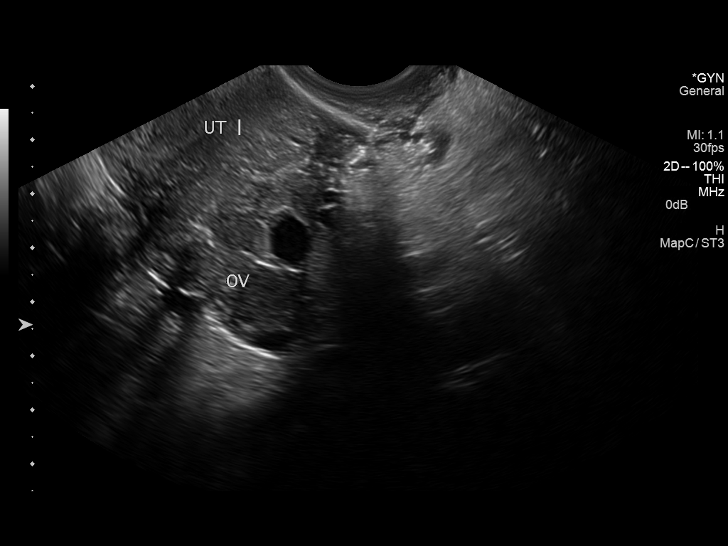

[13 of 25 positions shown; findings below may reference images not displayed]

FINDINGS: Uterus

Measurements: 7 x 4 x 5 cm. No fibroids or other mass visualized.

Endometrium

The endometrium is thin, measuring no more than 3 mm, but is
separated by mid level echoes which are mobile. The appearance is
similar to 10/16/2013.

Right ovary

Surgically absent.

Left ovary

Normal ovarian parenchyma measures 4 x 3.8 x 3.1 cm. Arising from or
immediately adjacent to the left ovary is a 5 x 3 x 3 cm
predominately nonvascular structure with homogeneous mid level
echoes throughout. There is a blood vessel or septation along the
upper margin. Appearance is unchanged from previous.

Pulsed Doppler evaluation of the remaining left ovary demonstrates
normal low-resistance arterial and venous waveforms.

Other findings

There is loculated fluid within the right and left pelvis with mid
level echoes. The overall volume is similar to that seen previous.
IMPRESSION: 1. Negative for left ovarian torsion (status post right salpingo
oophorectomy) or other acute pelvic finding.
2. Indeterminate 5 cm left adnexal mass, favor endometrioma based on
the internal echotexture. Given possible vascularized septation,
consider nonemergent/outpatient pelvic MRI.
3. Bilateral, chronic loculated pelvic fluid collections. If there
is endometriosis, these likely represent peritoneal inclusion cysts.
If there is history of pelvic inflammatory disease, these could
represent chronic abscess.
4. Hemorrhage or other debris distends the uterine cavity. Given
similar appearance 10/16/2013, question cervical stenosis.

## 2015-06-06 IMAGING — CR DG CHEST 2V
2 series · 2 of 2 positions shown · non-contrast
Comparison: Prior chest x-ray and CT scan of the chest 07/11/2008

CLINICAL DATA: One day history of chest pain and shortness of
breath

EXAM:
CHEST  2 VIEW

[view not recorded (1 of 2)]
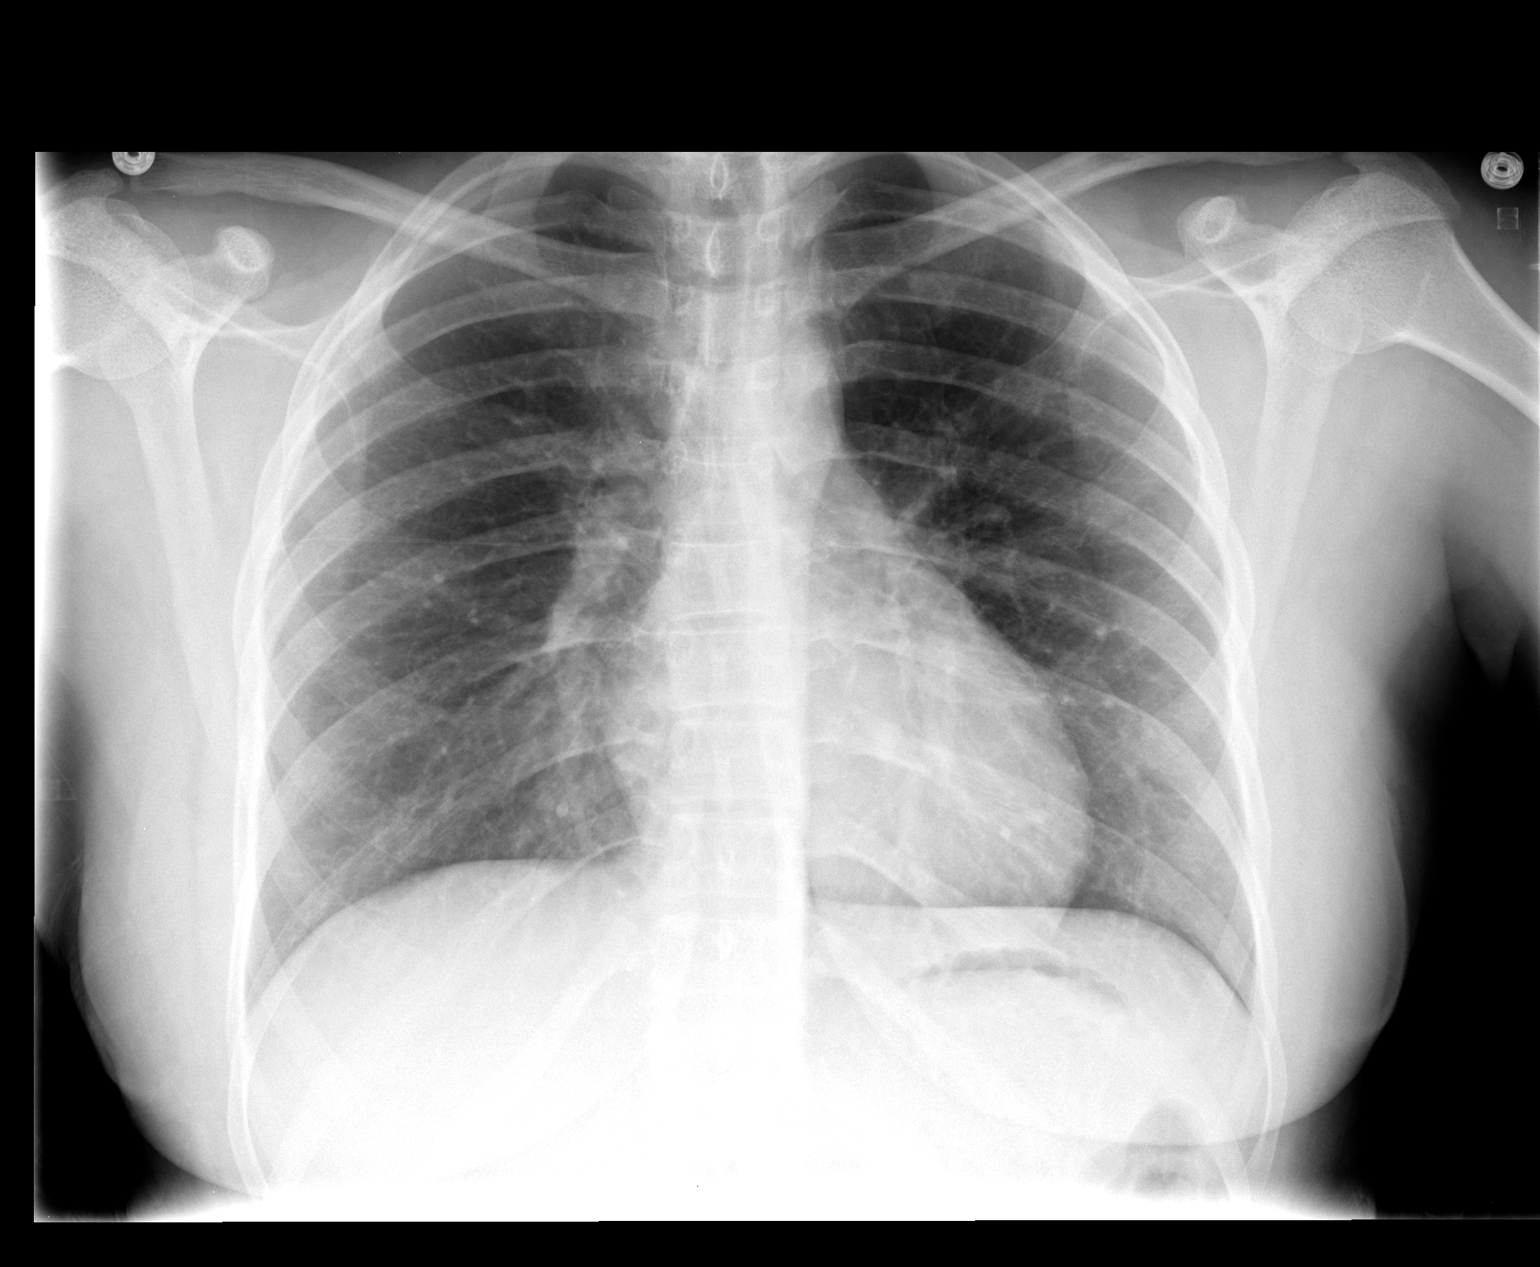

[view not recorded (2 of 2)]
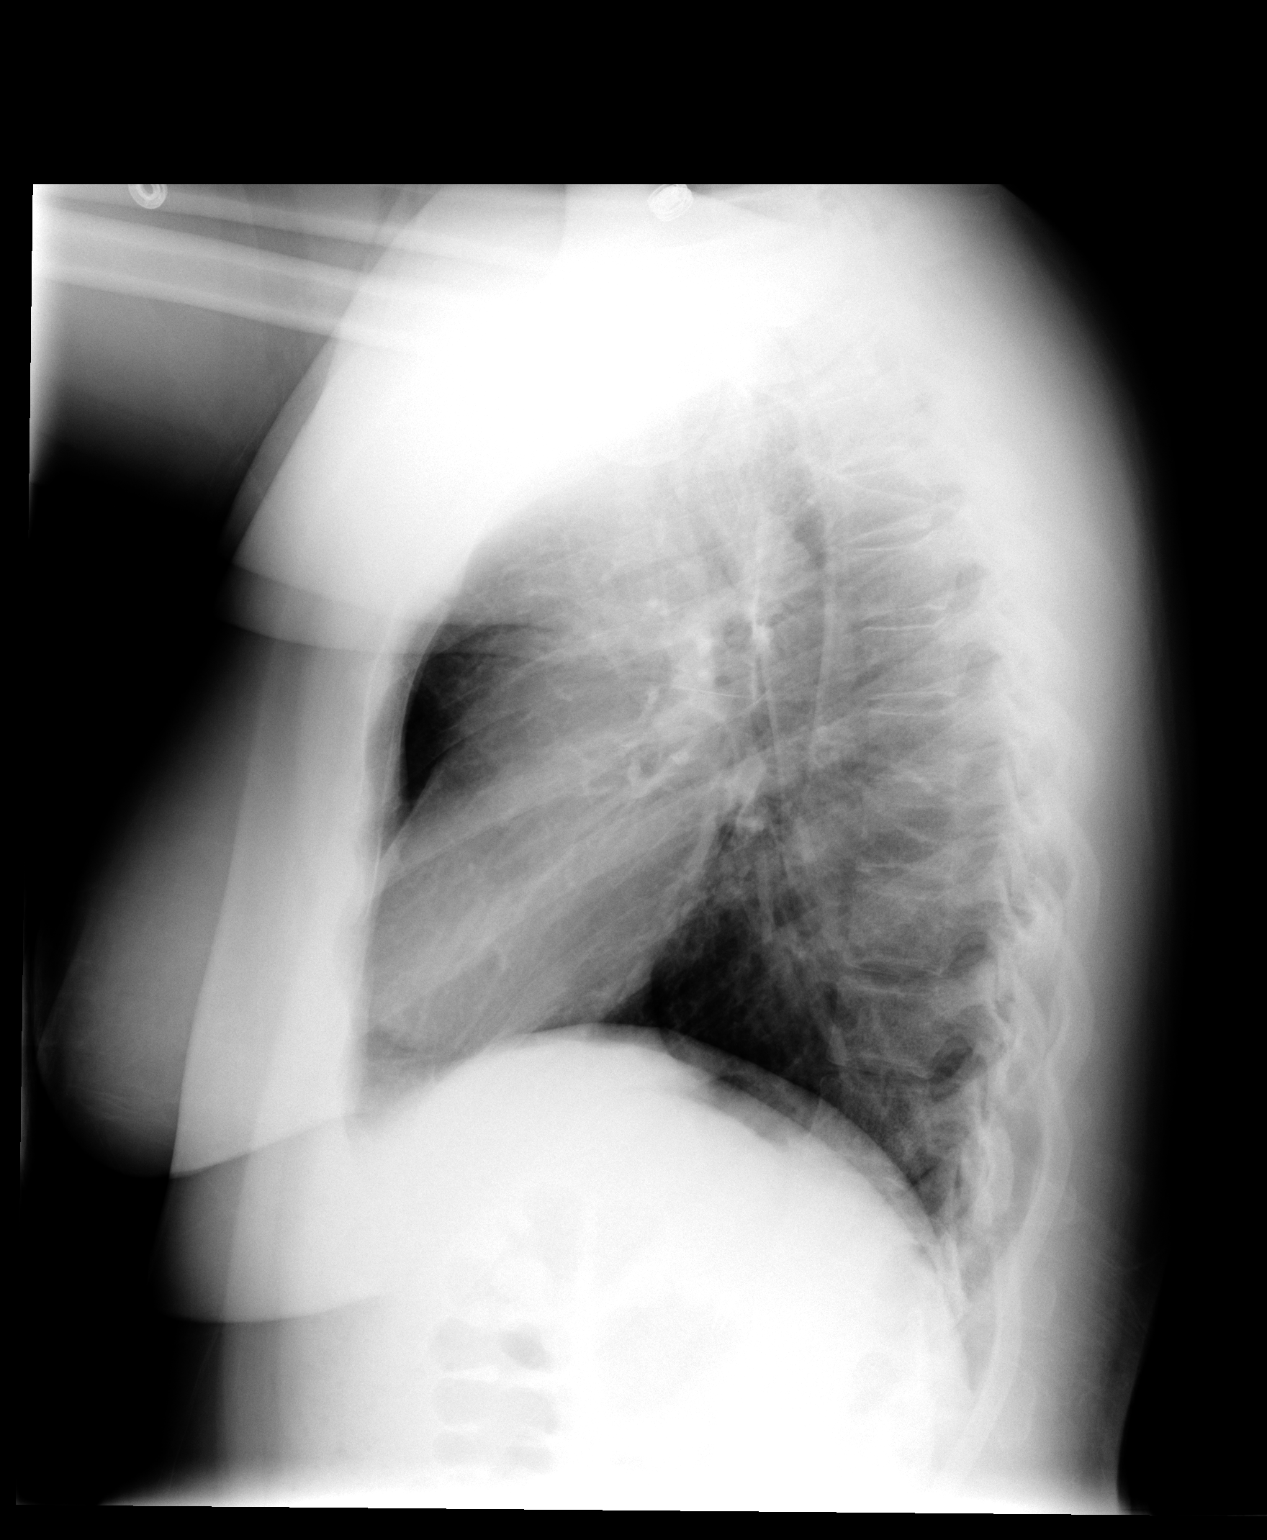

[2 of 2 positions shown; findings below may reference images not displayed]

FINDINGS: The lungs are clear and negative for focal airspace consolidation,
pulmonary edema or suspicious pulmonary nodule. No pleural effusion
or pneumothorax. Cardiac and mediastinal contours are within normal
limits. No acute fracture or lytic or blastic osseous lesions. The
visualized upper abdominal bowel gas pattern is unremarkable.
IMPRESSION: No active cardiopulmonary disease.

## 2015-07-18 ENCOUNTER — Encounter

## 2015-07-23 ENCOUNTER — Inpatient Hospital Stay: Admit: 2015-07-23 | Payer: MEDICAID | Primary: Family

## 2015-07-23 DIAGNOSIS — M79671 Pain in right foot: Secondary | ICD-10-CM

## 2015-07-24 ENCOUNTER — Encounter

## 2015-07-27 ENCOUNTER — Inpatient Hospital Stay
Admit: 2015-07-27 | Discharge: 2015-07-28 | Disposition: A | Discharge status: Home / Self Care | Payer: MEDICAID | Attending: Emergency Medicine

## 2015-07-27 ENCOUNTER — Emergency Department: Admit: 2015-07-27 | Payer: MEDICAID | Primary: Family

## 2015-07-27 DIAGNOSIS — R1032 Left lower quadrant pain: Secondary | ICD-10-CM

## 2015-07-27 LAB — URINALYSIS W/ RFLX MICROSCOPIC
Bilirubin: NEGATIVE
Blood: NEGATIVE
Glucose: NEGATIVE mg/dL
Ketone: NEGATIVE mg/dL
Leukocyte Esterase: NEGATIVE
Nitrites: NEGATIVE
Protein: NEGATIVE mg/dL
Specific gravity: 1.023 (ref 1.005–1.030)
Urobilinogen: 0.2 EU/dL (ref 0.2–1.0)
pH (UA): 6.5 (ref 5.0–8.0)

## 2015-07-27 LAB — CBC WITH AUTOMATED DIFF
ABS. BASOPHILS: 0 10*3/uL (ref 0.0–0.06)
ABS. EOSINOPHILS: 0.3 10*3/uL (ref 0.0–0.4)
ABS. LYMPHOCYTES: 2.7 10*3/uL (ref 0.9–3.6)
ABS. MONOCYTES: 0.7 10*3/uL (ref 0.05–1.2)
ABS. NEUTROPHILS: 4.7 10*3/uL (ref 1.8–8.0)
BASOPHILS: 1 % (ref 0–2)
EOSINOPHILS: 4 % (ref 0–5)
HCT: 39.2 % (ref 35.0–45.0)
HGB: 13.4 g/dL (ref 12.0–16.0)
LYMPHOCYTES: 32 % (ref 21–52)
MCH: 32.4 PG (ref 24.0–34.0)
MCHC: 34.2 g/dL (ref 31.0–37.0)
MCV: 94.7 FL (ref 74.0–97.0)
MONOCYTES: 8 % (ref 3–10)
MPV: 10.3 FL (ref 9.2–11.8)
NEUTROPHILS: 55 % (ref 40–73)
PLATELET: 301 10*3/uL (ref 135–420)
RBC: 4.14 M/uL — ABNORMAL LOW (ref 4.20–5.30)
RDW: 12.6 % (ref 11.6–14.5)
WBC: 8.5 10*3/uL (ref 4.6–13.2)

## 2015-07-27 LAB — METABOLIC PANEL, COMPREHENSIVE
A-G Ratio: 1.1 (ref 0.8–1.7)
ALT (SGPT): 23 U/L (ref 13–56)
AST (SGOT): 11 U/L — ABNORMAL LOW (ref 15–37)
Albumin: 3.7 g/dL (ref 3.4–5.0)
Alk. phosphatase: 50 U/L (ref 45–117)
Anion gap: 7 mmol/L (ref 3.0–18)
BUN/Creatinine ratio: 14 (ref 12–20)
BUN: 11 MG/DL (ref 7.0–18)
Bilirubin, total: 0.4 MG/DL (ref 0.2–1.0)
CO2: 25 mmol/L (ref 21–32)
Calcium: 8.8 MG/DL (ref 8.5–10.1)
Chloride: 110 mmol/L — ABNORMAL HIGH (ref 100–108)
Creatinine: 0.8 MG/DL (ref 0.6–1.3)
GFR est AA: >60 mL/min/{1.73_m2} (ref >60)
GFR est non-AA: >60 mL/min/{1.73_m2} (ref >60)
Globulin: 3.4 g/dL (ref 2.0–4.0)
Glucose: 89 mg/dL (ref 74–99)
Potassium: 4 mmol/L (ref 3.5–5.5)
Protein, total: 7.1 g/dL (ref 6.4–8.2)
Sodium: 142 mmol/L (ref 136–145)

## 2015-07-27 LAB — WET PREP

## 2015-07-27 LAB — HCG URINE, QL: HCG urine, QL: NEGATIVE

## 2015-07-27 MED ORDER — OXYCODONE-ACETAMINOPHEN 5 MG-325 MG TAB
5-325 mg | ORAL | Status: AC
Start: 2015-07-27 — End: 2015-07-27
  Administered 2015-07-27: 23:00:00 via ORAL

## 2015-07-27 MED FILL — OXYCODONE-ACETAMINOPHEN 5 MG-325 MG TAB: 5-325 mg | ORAL | Qty: 1

## 2015-07-27 NOTE — ED Provider Notes (Addendum)
HPI Comments: Darlyn ReadJennifer I Shutter is a 24 y.o. female that presents to the ED with a complaint of LLQ pain.  Patient states that she ahs had this pain for approximately 1 week and is associated with nausea, vaginal odor and discharge.  Patient admits to discharge that looked 'tissue-like.'  Patients medical history is significant for right ovarian torsion, and adrenal tumors.  Patient is being followed by GYN but missed her appointment this past Friday for US. Denies frequency, urgency, vomiting,     Patient is a 24 y.o. female presenting with abdominal pain.   Abdominal Pain    Associated symptoms include nausea.        History reviewed. No pertinent past medical history.    History reviewed. No pertinent past surgical history.      History reviewed. No pertinent family history.    Social History     Social History   ??? Marital status: SINGLE     Spouse name: N/A   ??? Number of children: N/A   ??? Years of education: N/A     Occupational History   ??? Not on file.     Social History Main Topics   ??? Smoking status: Current Every Day Smoker   ??? Smokeless tobacco: Not on file   ??? Alcohol use No   ??? Drug use: Not on file   ??? Sexual activity: Not on file     Other Topics Concern   ??? Not on file     Social History Narrative   ??? No narrative on file         ALLERGIES: Review of patient's allergies indicates no known allergies.    Review of Systems   Gastrointestinal: Positive for abdominal pain and nausea.   Genitourinary: Positive for vaginal discharge.   All other systems reviewed and are negative.      Vitals:    07/27/15 1749   BP: 122/85   Pulse: 77   Resp: 16   Temp: 98.2 ??F (36.8 ??C)   SpO2: 100%   Weight: 77.1 kg (170 lb)   Height: 5\' 5"  (1.651 m)            Physical Exam   Constitutional: She is oriented to person, place, and time. She appears well-developed and well-nourished.   HENT:   Head: Normocephalic and atraumatic.   Eyes: Conjunctivae are normal. Pupils are equal, round, and reactive to light.    Neck: Normal range of motion. Neck supple.   Cardiovascular: Normal rate, regular rhythm and normal heart sounds.    Pulmonary/Chest: Effort normal and breath sounds normal.   Abdominal: Soft. Bowel sounds are normal. She exhibits no distension and no mass. There is tenderness in the suprapubic area and left lower quadrant. There is guarding. There is no rigidity, no rebound, no CVA tenderness, no tenderness at McBurney's point and negative Murphy's sign. No hernia.   Genitourinary:   Genitourinary Comments: See procedure note   Musculoskeletal: Normal range of motion.   Neurological: She is alert and oriented to person, place, and time.   Skin: Skin is warm and dry.   Psychiatric: She has a normal mood and affect. Her behavior is normal.   Nursing note and vitals reviewed.       MDM  Number of Diagnoses or Management Options  Diagnosis management comments: Labs Reviewed  CBC WITH AUTOMATED DIFF - Abnormal; Notable for the following:      RBC  4.14 (*)            All other components within normal limits  METABOLIC PANEL, COMPREHENSIVE - Abnormal; Notable for the following:      Chloride                      110 (*)                AST                           11 (*)              All other components within normal limits  WET PREP  URINALYSIS W/ RFLX MICROSCOPIC  HCG URINE, QL  CHLAMYDIA/NEISSERIA AMPLIFICATION    [place]: negative as read by radiologist over phone    IMpression: abdominal pain    Plan: discharge home  Follo wup with GYN tomorrow.       Amount and/or Complexity of Data Reviewed  Clinical lab tests: ordered and reviewed  Tests in the radiology section of CPT??: ordered and reviewed      ED Course       Pelvic Exam  Date/Time: 07/27/2015 6:39 PM  Performed by: PA  Procedure duration:  5 minutes.  Documented by:  self.   Exam assisted by:  ED [facility] [place].  Type of exam performed: bimanual and speculum.    External genitalia appearance: normal.    Vaginal exam:  discharge.     The amount of discharge was:  mild.  The discharge was green and dark.   Cervical exam:  normal and os closed.    Specimen(s) collected:  chlamydia, GC and vaginal culture.  Bimanual exam:  left adenexal tenderness.    Patient tolerance: Patient tolerated the procedure well with no immediate complications

## 2015-07-27 NOTE — ED Triage Notes (Signed)
Pt c/o LLQ abdominal pain for one week. Arrived drinking coffee.

## 2015-07-27 NOTE — ED Notes (Signed)
Pt discharged to home ambulatory and in company of self  Discharge instructions provided via discussion and handout.   Teaching to patient.   Verbalized understanding.   No questions voiced.   Discharged with 1 RX.

## 2015-07-27 NOTE — ED Notes (Signed)
Went in to d/c pt  Pt states tramadol doesn't work for her all it does is make her vomit  States percocet 10 mg is what controls her pain  Provider aware

## 2015-07-28 LAB — CHLAMYDIA/NEISSERIA AMPLIFICATION
Chlamydia amplification: NEGATIVE
N. gonorrhoeae amplification: NEGATIVE

## 2015-07-28 MED ORDER — MORPHINE 2 MG/ML INJECTION
2 mg/mL | INTRAMUSCULAR | Status: AC
Start: 2015-07-28 — End: 2015-07-27
  Administered 2015-07-28: 01:00:00 via INTRAVENOUS

## 2015-07-28 MED ORDER — TRAMADOL 50 MG TAB
50 mg | ORAL_TABLET | Freq: Four times a day (QID) | ORAL | 0 refills | Status: DC | PRN
Start: 2015-07-28 — End: 2015-10-01

## 2015-07-28 MED FILL — MORPHINE 2 MG/ML INJECTION: 2 mg/mL | INTRAMUSCULAR | Qty: 1

## 2015-08-04 ENCOUNTER — Inpatient Hospital Stay: Admit: 2015-08-04 | Payer: MEDICAID | Attending: Orthopaedic Surgery | Primary: Family

## 2015-08-04 DIAGNOSIS — M722 Plantar fascial fibromatosis: Secondary | ICD-10-CM

## 2015-10-01 ENCOUNTER — Inpatient Hospital Stay: Admit: 2015-10-01 | Payer: MEDICAID | Primary: Family

## 2015-10-01 ENCOUNTER — Encounter

## 2015-10-01 DIAGNOSIS — Z01818 Encounter for other preprocedural examination: Secondary | ICD-10-CM

## 2015-10-01 LAB — SENTARA SPECIMEN COLLN.

## 2015-10-01 NOTE — Other (Signed)
PAT - SURGICAL PRE-ADMISSION INSTRUCTIONS    NAME:  Mia Campbell                                                          TODAY'S DATE:  10/01/2015    SURGERY DATE:  10/14/2015                                  SURGERY ARRIVAL TIME:   0945    1. Do NOT eat or drink anything, including candy or gum, after MIDNIGHT on 10/13/15 , unless you have specific instructions from your Surgeon or Anesthesia Provider to do so.  2. No smoking on the day of surgery.  3. No alcohol 24 hours prior to the day of surgery.  4. No recreational drugs for one week prior to the day of surgery.  5. Leave all valuables, including money/purse, at home.  6. Remove all jewelry, nail polish, makeup (including mascara); no lotions, powders, deodorant, or perfume/cologne/after shave.  7. Glasses/Contact lenses and Dentures may be worn to the hospital.  They will be removed prior to surgery.  8. Call your doctor if symptoms of a cold or illness develop within 24 ours prior to surgery.  9. AN ADULT MUST DRIVE YOU HOME AFTER OUTPATIENT SURGERY.   10. If you are having an OUTPATIENT procedure, please make arrangements for a responsible adult to be with you for 24 hours after your surgery.  11. If you are admitted to the hospital, you will be assigned to a bed after surgery is complete.  Normally a family member will not be able to see you until you are in your assigned bed.  12. Family is encouraged to accompany you to the hospital.  We ask visitors in the treatment area to be limited to ONE person at a time to ensure patient privacy.  EXCEPTIONS WILL BE MADE AS NEEDED.  13. Children under 12 are discouraged from entering the treatment area and need to be supervised by an adult when in the waiting room.    Special Instructions:    NONE.    Patient Prep:    use CHG solution    These surgical instructions were reviewed with [name] during the PAT visit.  A printed copy of the instructions was provided to [facility].     Directions:  On the morning of surgery, please go to the [facility].  Enter the building from the [employer] lot entrance, 1st floor (next to the Emergency Room entrance).  Take the elevator to the 2nd floor.  Sign in at the Registration Desk.    If you have any questions and/or concerns, please do not hesitate to call:  (Prior to the day of surgery)  [facility] unit:  [phone]  (Day of surgery)  [facility] unit:  [phone]

## 2015-10-09 ENCOUNTER — Inpatient Hospital Stay: Payer: MEDICAID

## 2015-10-09 LAB — HCG URINE, QL. - POC: Pregnancy test,urine (POC): NEGATIVE

## 2015-10-09 MED ORDER — MIDAZOLAM 1 MG/ML IJ SOLN
1 mg/mL | INTRAMUSCULAR | Status: AC
Start: 2015-10-09 — End: ?

## 2015-10-09 MED ORDER — BACITRACIN 50,000 UNIT IM
50000 unit | INTRAMUSCULAR | Status: DC | PRN
Start: 2015-10-09 — End: 2015-10-09
  Administered 2015-10-09: 16:00:00

## 2015-10-09 MED ORDER — BACITRACIN 500 UNIT/G OINTMENT
500 unit/gram | CUTANEOUS | Status: AC
Start: 2015-10-09 — End: ?

## 2015-10-09 MED ORDER — HYDROMORPHONE (PF) 2 MG/ML IJ SOLN
2 mg/mL | INTRAMUSCULAR | Status: DC | PRN
Start: 2015-10-09 — End: 2015-10-09
  Administered 2015-10-09 (×2): via INTRAVENOUS

## 2015-10-09 MED ORDER — DEXAMETHASONE SODIUM PHOSPHATE 4 MG/ML IJ SOLN
4 mg/mL | INTRAMUSCULAR | Status: AC
Start: 2015-10-09 — End: ?

## 2015-10-09 MED ORDER — PROPOFOL 10 MG/ML IV EMUL
10 mg/mL | INTRAVENOUS | Status: DC | PRN
Start: 2015-10-09 — End: 2015-10-09
  Administered 2015-10-09: 16:00:00 via INTRAVENOUS

## 2015-10-09 MED ORDER — CEFAZOLIN 1 GRAM SOLUTION FOR INJECTION
1 gram | INTRAMUSCULAR | Status: AC
Start: 2015-10-09 — End: ?

## 2015-10-09 MED ORDER — VANCOMYCIN 1,000 MG IV SOLR
1000 mg | INTRAVENOUS | Status: DC | PRN
Start: 2015-10-09 — End: 2015-10-09
  Administered 2015-10-09: 16:00:00 via TOPICAL

## 2015-10-09 MED ORDER — SODIUM CHLORIDE 0.9 % IJ SYRG
INTRAMUSCULAR | Status: DC | PRN
Start: 2015-10-09 — End: 2015-10-09

## 2015-10-09 MED ORDER — HYDROMORPHONE 2 MG/ML INJECTION SOLUTION
2 mg/mL | INTRAMUSCULAR | Status: DC | PRN
Start: 2015-10-09 — End: 2015-10-09
  Administered 2015-10-09: 18:00:00 via INTRAVENOUS

## 2015-10-09 MED ORDER — THROMBIN (BOVINE) 5,000 UNIT TOPICAL SOLUTION
5000 unit | CUTANEOUS | Status: DC | PRN
Start: 2015-10-09 — End: 2015-10-09
  Administered 2015-10-09: 16:00:00 via TOPICAL

## 2015-10-09 MED ORDER — LIDOCAINE (PF) 20 MG/ML (2 %) IJ SOLN
20 mg/mL (2 %) | INTRAMUSCULAR | Status: DC | PRN
Start: 2015-10-09 — End: 2015-10-09
  Administered 2015-10-09: 16:00:00 via INTRAVENOUS

## 2015-10-09 MED ORDER — LACTATED RINGERS IV
INTRAVENOUS | Status: DC | PRN
Start: 2015-10-09 — End: 2015-10-09
  Administered 2015-10-09: 16:00:00 via INTRAVENOUS

## 2015-10-09 MED ORDER — LIDOCAINE HCL 1 % (10 MG/ML) IJ SOLN
10 mg/mL (1 %) | INTRAMUSCULAR | Status: AC
Start: 2015-10-09 — End: ?

## 2015-10-09 MED ORDER — GELATIN ADSORBABLE MUCOSAL POWDER
Status: AC
Start: 2015-10-09 — End: ?

## 2015-10-09 MED ORDER — ONDANSETRON (PF) 4 MG/2 ML INJECTION
4 mg/2 mL | Freq: Once | INTRAMUSCULAR | Status: AC
Start: 2015-10-09 — End: 2015-10-09
  Administered 2015-10-09: 19:00:00 via INTRAVENOUS

## 2015-10-09 MED ORDER — BACITRACIN 50,000 UNIT IM
50000 unit | INTRAMUSCULAR | Status: AC
Start: 2015-10-09 — End: ?

## 2015-10-09 MED ORDER — FENTANYL CITRATE (PF) 50 MCG/ML IJ SOLN
50 mcg/mL | INTRAMUSCULAR | Status: AC
Start: 2015-10-09 — End: ?

## 2015-10-09 MED ORDER — CEFAZOLIN 2 G IN 100 ML 0.9% NS
2 gram/100 mL | INTRAVENOUS | Status: DC | PRN
Start: 2015-10-09 — End: 2015-10-09
  Administered 2015-10-09: 16:00:00 via INTRAVENOUS

## 2015-10-09 MED ORDER — ROPIVACAINE (PF) 5 MG/ML (0.5 %) INJECTION
5 mg/mL (0. %) | INTRAMUSCULAR | Status: DC | PRN
Start: 2015-10-09 — End: 2015-10-09
  Administered 2015-10-09: 16:00:00 via SUBCUTANEOUS

## 2015-10-09 MED ORDER — FENTANYL CITRATE (PF) 50 MCG/ML IJ SOLN
50 mcg/mL | INTRAMUSCULAR | Status: DC | PRN
Start: 2015-10-09 — End: 2015-10-09
  Administered 2015-10-09 (×2): via INTRAVENOUS

## 2015-10-09 MED ORDER — ONDANSETRON (PF) 4 MG/2 ML INJECTION
4 mg/2 mL | INTRAMUSCULAR | Status: DC | PRN
Start: 2015-10-09 — End: 2015-10-09
  Administered 2015-10-09: 16:00:00 via INTRAVENOUS

## 2015-10-09 MED ORDER — PROPOFOL 10 MG/ML IV EMUL
10 mg/mL | INTRAVENOUS | Status: AC
Start: 2015-10-09 — End: ?

## 2015-10-09 MED ORDER — DEXAMETHASONE SODIUM PHOSPHATE 4 MG/ML IJ SOLN
4 mg/mL | INTRAMUSCULAR | Status: DC | PRN
Start: 2015-10-09 — End: 2015-10-09
  Administered 2015-10-09: 16:00:00 via INTRAVENOUS

## 2015-10-09 MED ORDER — ONDANSETRON (PF) 4 MG/2 ML INJECTION
4 mg/2 mL | INTRAMUSCULAR | Status: AC
Start: 2015-10-09 — End: ?

## 2015-10-09 MED ORDER — FENTANYL CITRATE (PF) 50 MCG/ML IJ SOLN
50 mcg/mL | INTRAMUSCULAR | Status: DC | PRN
Start: 2015-10-09 — End: 2015-10-09

## 2015-10-09 MED ORDER — ROPIVACAINE (PF) 5 MG/ML (0.5 %) INJECTION
5 mg/mL (0. %) | INTRAMUSCULAR | Status: AC
Start: 2015-10-09 — End: ?

## 2015-10-09 MED ORDER — MIDAZOLAM 1 MG/ML IJ SOLN
1 mg/mL | INTRAMUSCULAR | Status: DC | PRN
Start: 2015-10-09 — End: 2015-10-09
  Administered 2015-10-09: 16:00:00 via INTRAVENOUS

## 2015-10-09 MED ORDER — HYDROMORPHONE 2 MG/ML INJECTION SOLUTION
2 mg/mL | INTRAMUSCULAR | Status: AC
Start: 2015-10-09 — End: ?

## 2015-10-09 MED ORDER — LIDOCAINE (PF) 20 MG/ML (2 %) IJ SOLN
20 mg/mL (2 %) | INTRAMUSCULAR | Status: AC
Start: 2015-10-09 — End: ?

## 2015-10-09 MED ORDER — BACITRACIN 500 UNIT/G OINTMENT
500 unit/gram | CUTANEOUS | Status: DC | PRN
Start: 2015-10-09 — End: 2015-10-09
  Administered 2015-10-09: 16:00:00 via TOPICAL

## 2015-10-09 MED ORDER — LACTATED RINGERS IV
INTRAVENOUS | Status: DC
Start: 2015-10-09 — End: 2015-10-09

## 2015-10-09 MED ORDER — THROMBIN (BOVINE) 5,000 UNIT TOPICAL SOLUTION
5000 unit | CUTANEOUS | Status: AC
Start: 2015-10-09 — End: ?

## 2015-10-09 MED ORDER — OXYCODONE-ACETAMINOPHEN 10 MG-325 MG TAB
10-325 mg | Freq: Once | ORAL | Status: AC
Start: 2015-10-09 — End: 2015-10-09
  Administered 2015-10-09: 19:00:00 via ORAL

## 2015-10-09 MED FILL — LIDOCAINE (PF) 20 MG/ML (2 %) IJ SOLN: 20 mg/mL (2 %) | INTRAMUSCULAR | Qty: 5

## 2015-10-09 MED FILL — THROMBIN-JMI 5,000 UNIT TOPICAL SOLUTION: 5000 unit | CUTANEOUS | Qty: 2

## 2015-10-09 MED FILL — OXYCODONE-ACETAMINOPHEN 10 MG-325 MG TAB: 10-325 mg | ORAL | Qty: 1

## 2015-10-09 MED FILL — BACITRACIN 50,000 UNIT IM: 50000 unit | INTRAMUSCULAR | Qty: 50000

## 2015-10-09 MED FILL — MIDAZOLAM 1 MG/ML IJ SOLN: 1 mg/mL | INTRAMUSCULAR | Qty: 2

## 2015-10-09 MED FILL — NAROPIN (PF) 5 MG/ML (0.5 %) INJECTION SOLUTION: 5 mg/mL (0. %) | INTRAMUSCULAR | Qty: 30

## 2015-10-09 MED FILL — PROPOFOL 10 MG/ML IV EMUL: 10 mg/mL | INTRAVENOUS | Qty: 20

## 2015-10-09 MED FILL — BACITRACIN 500 UNIT/G OINTMENT: 500 unit/gram | CUTANEOUS | Qty: 14

## 2015-10-09 MED FILL — BD POSIFLUSH NORMAL SALINE 0.9 % INJECTION SYRINGE: INTRAMUSCULAR | Qty: 10

## 2015-10-09 MED FILL — LIDOCAINE HCL 1 % (10 MG/ML) IJ SOLN: 10 mg/mL (1 %) | INTRAMUSCULAR | Qty: 20

## 2015-10-09 MED FILL — DEXAMETHASONE SODIUM PHOSPHATE 4 MG/ML IJ SOLN: 4 mg/mL | INTRAMUSCULAR | Qty: 1

## 2015-10-09 MED FILL — HYDROMORPHONE 2 MG/ML INJECTION SOLUTION: 2 mg/mL | INTRAMUSCULAR | Qty: 1

## 2015-10-09 MED FILL — LACTATED RINGERS IV: INTRAVENOUS | Qty: 1000

## 2015-10-09 MED FILL — GELFOAM MUCOSAL POWDER: Qty: 2

## 2015-10-09 MED FILL — FENTANYL CITRATE (PF) 50 MCG/ML IJ SOLN: 50 mcg/mL | INTRAMUSCULAR | Qty: 2

## 2015-10-09 MED FILL — CEFAZOLIN 1 GRAM SOLUTION FOR INJECTION: 1 gram | INTRAMUSCULAR | Qty: 2000

## 2015-10-09 MED FILL — ONDANSETRON (PF) 4 MG/2 ML INJECTION: 4 mg/2 mL | INTRAMUSCULAR | Qty: 2

## 2015-10-09 NOTE — Anesthesia Pre-Procedure Evaluation (Signed)
Anesthetic History   No history of anesthetic complications            Review of Systems / Medical History  Patient summary reviewed and pertinent labs reviewed    Pulmonary          Smoker         Neuro/Psych   Within defined limits           Cardiovascular  Within defined limits                Exercise tolerance: >4 METS     GI/Hepatic/Renal  Within defined limits              Endo/Other  Within defined limits           Other Findings   Comments: Current Smoker?  YES       Elective Surgery? Yes       Abstained from smoking 24 hours prior to anesthesia?  NO    Risk Factors for Postoperative nausea/vomiting:       History of postoperative nausea/vomiting?  NO       Female?  YES       Motion sickness?  NO       Intended opioid administration for postoperative analgesia?  YES           Physical Exam    Airway  Mallampati: II  TM Distance: 4 - 6 cm  Neck ROM: normal range of motion   Mouth opening: Normal     Cardiovascular  Regular rate and rhythm,  S1 and S2 normal,  no murmur, click, rub, or gallop  Rhythm: regular  Rate: normal         Dental    Dentition: Lower dentition intact and Upper dentition intact     Pulmonary  Breath sounds clear to auscultation               Abdominal  GI exam deferred       Other Findings            Anesthetic Plan    ASA: 2  Anesthesia type: general          Induction: Intravenous  Anesthetic plan and risks discussed with: Patient

## 2015-10-09 NOTE — Other (Signed)
Patient examined   No change from previous evaluation  History and physical reviewed  Pt stable  Dequan Kindred Michael Marsh Heckler, MD

## 2015-10-09 NOTE — Other (Signed)
Onalee Hua friend is ok to talk to Dr. Cheree Ditto

## 2015-10-09 NOTE — Other (Addendum)
1252  Patient received in PACU and connected to monitors. Vital signs stable. RN at bedside. Will continue to monitor.    1305  Tolerating ice water po.  C/o R foot "bothering me, I need to take this off".  Instructed pt that boot must stay on, elevated and ice applied.    1335  Report given to T. Vear Clock, RN in Phase 2.

## 2015-10-09 NOTE — Anesthesia Post-Procedure Evaluation (Signed)
Post-Anesthesia Evaluation and Assessment    Patient: Mia Campbell MRN: [id number]  SSN: [id number]    Date of Birth: 30-Jan-1991  Age: 25 y.o.  Sex: female       Cardiovascular Function/Vital Signs  Visit Vitals   ??? BP 128/65   ??? Pulse 81   ??? Temp 37 ??C (98.6 ??F)   ??? Resp 17   ??? Ht  (1.651 m)   ??? Wt 73 kg (161 lb)   ??? SpO2 95%   ??? BMI 26.79 kg/m2       Patient is status post general anesthesia for Procedure(s):  right TARSAL TUNNEL RELEASE.    Nausea/Vomiting: None    Postoperative hydration reviewed and adequate.    Pain:  Pain Scale 1: Numeric (0 - 10) (10/09/15 1252)  Pain Intensity 1: 0 (10/09/15 1252)   Managed    Neurological Status:   Neuro (WDL): Within Defined Limits (10/09/15 1252)  Neuro  LUE Motor Response: Purposeful;Spontaneous  (10/09/15 1252)  LLE Motor Response: Purposeful;Spontaneous  (10/09/15 1252)  RUE Motor Response: Purposeful;Spontaneous  (10/09/15 1252)  RLE Motor Response: Numbness;Other(comment) (Local anesthetic intraop) (10/09/15 1252)   At baseline    Mental Status and Level of Consciousness: Arousable    Pulmonary Status:   O2 Device: Oxygen mask (10/09/15 1255)   Adequate oxygenation and airway patent    Complications related to anesthesia: None    Post-anesthesia assessment completed. No concerns    Signed By: Billy Fischer, MD     October 09, 2015

## 2015-10-09 NOTE — Brief Op Note (Signed)
BRIEF OPERATIVE NOTE    Patient: Mia Campbell  Sex: female          DOA: 10/09/2015       Date of Birth:  03-Jul-1991      MRN: [id number] CSN:  [id number]      Date of Procedure: 10/09/2015     Preoperative Diagnosis:  g57.51,m25.571; Tarsal tunnel syndrome, right lower limb, Pain in right ankle and joints of right foot    Postoperative Diagnosis:  same  Procedure: Procedure(s):  right TARSAL TUNNEL RELEASE, and neurolysis of LPn and MPn into the foot    Surgeon(s) and Role:     * Twylia Oka Rueben Bash, MD - Primary    Anesthesia: General     Estimated Blood Loss: minimal    Specimens: * No specimens in log *     Findings: same    Complications: None    Implants: * No implants in log *    Avionna Bower Rueben Bash, MD  October 09, 2015

## 2015-10-09 NOTE — Op Note (Signed)
[facility] MEDICAL CENTER  OPERATIVE REPORTS    Name:  Mia Campbell, Mia Campbell  MR#:  [id number]  DOB:  [birth year]  Account #:  [id number]  Date of Adm:  10/09/2015  Date of Surgery:  10/09/2015       PREOPERATIVE DIAGNOSIS:    POSTOPERATIVE DIAGNOSES  1. Right tarsal tunnel syndrome.  2. Compression of the medial plantar and lateral plantar nerves as they  pass deep to the abductor hallucis muscle into the plantar aspect of  the foot.    PROCEDURES PERFORMED: All performed on the right side were  1. Right tarsal tunnel release.  2. Neurolysis and release of the lateral plantar nerve and the medial  plantar nerve deep to the abductor hallucis muscle with a  decompression, all the way into the plantar aspect of the foot.    SURGEON: Decarlo Rivet. Rueben Bash, MD    ANESTHESIA: General.    ESTIMATED BLOOD LOSS: Minimal.    SPECIMENS REMOVED: none    FINDINGS: Compression of the posterior tibial nerve within the tarsal  tunnel deep to the laciniate ligament and also more proximally the  deep fascia of the distal leg. In the foot, both lateral and medial plantar  nerves were compressed in a ribbon-like fashion up against the lateral  aspect of the inferior calcaneus they passed into the foot.    DESCRIPTION OF PROCEDURE: The patient was brought to the  operating room, placed supine on the operating table, given a general  anesthetic with endotracheal intubation. Appropriate antibiotic  prophylaxis. She was then prepped and draped in the usual standard  fashion. The right leg was elevated, exsanguinated, and given  appropriate antibiotic prophylaxis. The right leg was elevated,  exsanguinated with an Esmarch bandage and a right thigh tourniquet  was inflated to 350 mmHg of pressure.    A long incision, roughly 14 cm in length, was made starting about 3-4  cm proximal to the medial malleolus coursing behind the medial  malleolus and then passing down over the medial aspect of the   abductor hallucis muscle onto the plantar aspect of the foot, distal to  the weightbearing fat pad of the heel. In the proximal half of the  incision, the tarsal tunnel release was performed. The dissection was  carried down to the tarsal tunnel. The laciniate ligament was markedly  thickened, and it was released. The nerve was identified deep to the  laciniate ligament. There, it appeared to be compressed. The nerve  was followed proximally to the deep fascia of the foreleg where the  deep fascia itself was extremely tight and also appeared to be putting  some compression on the nerve and so that was released so that at  the end of that release it was possible to pass a small finger all the way  up into the fore leg area under the fascia, so the nerve had plenty of  room. The nerve was then followed distally. Each of the individual  branches at the bifurcation were identified including the medial and  lateral plantar nerve. The Baxter nerve and the sensory cutaneous  nerve were all identified and separately tagged. The vascular  structures were very carefully teased away from the nerve structures  and protected with a vessel loop. Each of these individual nerves were  inspected to be certain that there was no remaining compression in the  tarsal tunnel.    The next procedure is neurolysis and release of the lateral plantar  nerve and  the medial plantar nerve into the foot. The distal 7 cm of the  incision was started at the superior aspect of the abductor hallucis  muscle and extended down onto the plantar aspect of the foot. At the  distal aspect of the tarsal tunnel, the muscle fibers of the abductor  hallucis brevis were identified. The external fascial sheath was  released and then the muscle belly was mobilized bluntly off the deep  fascia and then the muscle belly was protected and preserved  throughout the operation. The deep fascia was markedly thickened,   and it was clearly compressing the medial plantar nerve and the lateral  plantar nerve as they passed deep to it, so each of these nerves  were then separately released. The Joker was passed under the deep  fascia between the nerve and the fascia and very carefully from  proximal to distal, the fascia was released throughout the entire extent.  The nerve as it was compressed quite significantly deep to this fascia  all the way down into the foot over a distance of 2-3 cm. The nerve had  a flat, ribbon-like appearance.    At the end of that release, it was possible to pass a finger through its  canal into the undersurface of the foot. More proximally, again at the  superior aspect of the abductor hallucis muscle, the lateral plantar  nerve was separately identified and it passed deep in its own separate  tunnel due to the thickened deep fascia of the abductor hallucis and a  blunt instrument was inserted to separate the nerve from the deep  fascia and systematically from proximal to distal, throughout the length  of the incision into the foot, the deep fascia was released. This nerve  appeared to be flattened and ribbon-like, so it was compressed over 2-  3 cm, particularly as it passed under the inferior edge of the calcaneal  bone into the foot. At this point, all the nerves had been released and  were clearly all decompressed, including both plantar nerves, Baxter  nerve and the digital branches. The tourniquet was deflated. Bleeding  was controlled with compression and cauterization and elevation  and thrombin. At the end of the operation, the wound was dry, so no  drain was utilized. The wound was closed very carefully in layers using  2-0 Vicryl deep sutures, 3-0 Vicryl subdermal sutures and 4-0 nylon  vertical mattress sutures for the skin. A local block around the incision  was administered prior to application of the dressing and the posterior  splint. The patient was then recovered from anesthesia without   difficulty and transferred to the recovery room having tolerated the  procedure well.        Joline Maxcy, MD    RG / TW  D:  10/09/2015   12:45  T:  10/09/2015   15:22  Job #:  [id number]

## 2015-10-09 NOTE — Other (Signed)
David-friend-has pt's [employer] account planner

## 2015-10-09 NOTE — Op Note (Signed)
[facility] MEDICAL CENTER  OPERATIVE REPORTS    Name:  Mia Campbell, Mia Campbell  MR#:  [id number]  DOB:  [birth year]  Account #:  [id number]  Date of Adm:  10/09/2015  Date of Surgery:  10/09/2015       PREOPERATIVE DIAGNOSIS:    POSTOPERATIVE DIAGNOSES  1. Right tarsal tunnel syndrome.  2. Compression of the medial plantar and lateral plantar nerves as they  pass deep to the abductor hallucis muscle into the plantar aspect of  the foot.    PROCEDURES PERFORMED: All performed on the right side were  1. Right tarsal tunnel release.  2. Neurolysis and release of the lateral plantar nerve and the medial  plantar nerve deep to the abductor hallucis muscle with a  decompression, all the way into the plantar aspect of the foot.    SURGEON: Enas Winchel. Rueben Bash, MD    ANESTHESIA: General.    ESTIMATED BLOOD LOSS: Minimal.    SPECIMENS REMOVED: none    FINDINGS: Compression of the posterior tibial nerve within the tarsal  tunnel deep to the laciniate ligament and also more proximally the  deep fascia of the distal leg. In the foot, both lateral and medial plantar  nerves were compressed in a ribbon-like fashion up against the lateral  aspect of the inferior calcaneus they passed into the foot.    DESCRIPTION OF PROCEDURE: The patient was brought to the  operating room, placed supine on the operating table, given a general  anesthetic with endotracheal intubation. Appropriate antibiotic  prophylaxis. She was then prepped and draped in the usual standard  fashion. The right leg was elevated, exsanguinated, and given  appropriate antibiotic prophylaxis. The right leg was elevated,  exsanguinated with an Esmarch bandage and a right thigh tourniquet  was inflated to 350 mmHg of pressure.    A long incision, roughly 14 cm in length, was made starting about 3-4  cm proximal to the medial malleolus coursing behind the medial  malleolus and then passing down over the medial aspect of the  abductor hallucis muscle onto the plantar aspect  of the foot, distal to  the weightbearing fat pad of the heel. In the proximal half of the  incision, the tarsal tunnel release was performed. The dissection was  carried down to the tarsal tunnel. The laciniate ligament was markedly  thickened, and it was released. The nerve was identified deep to the  laciniate ligament. There, it appeared to be compressed. The nerve  was followed proximally to the deep fascia of the foreleg where the  deep fascia itself was extremely tight and also appeared to be putting  some compression on the nerve and so that was released so that at  the end of that release it was possible to pass a small finger all the way  up into the fore leg area under the fascia, so the nerve had plenty of  room. The nerve was then followed distally. Each of the individual  branches at the bifurcation were identified including the medial and  lateral plantar nerve. The Baxter nerve and the sensory cutaneous  nerve were all identified and separately tagged. The vascular  structures were very carefully teased away from the nerve structures  and protected with a vessel loop. Each of these individual nerves were  inspected to be certain that there was no remaining compression in the  tarsal tunnel.    The next procedure is neurolysis and release of the lateral plantar  nerve and  the medial plantar nerve into the foot. The distal 7 cm of the  incision was started at the superior aspect of the abductor hallucis  muscle and extended down onto the plantar aspect of the foot. At the  distal aspect of the tarsal tunnel, the muscle fibers of the abductor  hallucis brevis were identified. The external fascial sheath was  released and then the muscle belly was mobilized bluntly off the deep  fascia and then the muscle belly was protected and preserved  throughout the operation. The deep fascia was markedly thickened,  and it was clearly compressing the medial plantar nerve and the lateral  plantar nerve as they  passed deep to it, so each of these nerves  were then separately released. The Joker was passed under the deep  fascia between the nerve and the fascia and very carefully from  proximal to distal, the fascia was released throughout the entire extent.  The nerve as it was compressed quite significantly deep to this fascia  all the way down into the foot over a distance of 2-3 cm. The nerve had  a flat, ribbon-like appearance.    At the end of that release, it was possible to pass a finger through its  canal into the undersurface of the foot. More proximally, again at the  superior aspect of the abductor hallucis muscle, the lateral plantar  nerve was separately identified and it passed deep in its own separate  tunnel due to the thickened deep fascia of the abductor hallucis and a  blunt instrument was inserted to separate the nerve from the deep  fascia and systematically from proximal to distal, throughout the length  of the incision into the foot, the deep fascia was released. This nerve  appeared to be flattened and ribbon-like, so it was compressed over 2-  3 cm, particularly as it passed under the inferior edge of the calcaneal  bone into the foot. At this point, all the nerves had been released and  were clearly all decompressed, including both plantar nerves, Baxter  nerve and the digital branches. The tourniquet was deflated. Bleeding  was controlled with compression and cauterization and elevation  and thrombin. At the end of the operation, the wound was dry, so no  drain was utilized. The wound was closed very carefully in layers using  2-0 Vicryl deep sutures, 3-0 Vicryl subdermal sutures and 4-0 nylon  vertical mattress sutures for the skin. A local block around the incision  was administered prior to application of the dressing and the posterior  splint. The patient was then recovered from anesthesia without  difficulty and transferred to the recovery room having tolerated the  procedure well.        Joline Maxcy, MD    RG / TW  D:  10/09/2015   12:45  T:  10/09/2015   15:22  Job #:  [id number]

## 2015-10-13 MED FILL — DEXAMETHASONE SODIUM PHOSPHATE 4 MG/ML IJ SOLN: 4 mg/mL | INTRAMUSCULAR | Qty: 4

## 2015-10-13 MED FILL — DIPRIVAN 10 MG/ML INTRAVENOUS EMULSION: 10 mg/mL | INTRAVENOUS | Qty: 200

## 2015-10-13 MED FILL — LACTATED RINGERS IV: INTRAVENOUS | Qty: 600

## 2015-10-13 MED FILL — ONDANSETRON (PF) 4 MG/2 ML INJECTION: 4 mg/2 mL | INTRAMUSCULAR | Qty: 4

## 2015-10-13 MED FILL — LIDOCAINE (PF) 20 MG/ML (2 %) IJ SOLN: 20 mg/mL (2 %) | INTRAMUSCULAR | Qty: 60

## 2015-10-17 ENCOUNTER — Inpatient Hospital Stay
Admit: 2015-10-17 | Discharge: 2015-10-18 | Disposition: A | Discharge status: Home / Self Care | Payer: MEDICAID | Attending: Emergency Medicine

## 2015-10-17 ENCOUNTER — Emergency Department: Admit: 2015-10-17 | Payer: MEDICAID | Primary: Family

## 2015-10-17 DIAGNOSIS — K5903 Drug induced constipation: Secondary | ICD-10-CM

## 2015-10-17 LAB — HCG URINE, QL. - POC: Pregnancy test,urine (POC): NEGATIVE

## 2015-10-17 MED ORDER — LIDOCAINE 2 % MUCOSAL SOLN
2 % | Status: AC
Start: 2015-10-17 — End: 2015-10-17
  Administered 2015-10-17: 21:00:00

## 2015-10-17 MED ORDER — SODIUM PHOSPHATES 19 GRAM-7 GRAM/118 ML ENEMA
19-7 gram/118 mL | RECTAL | 1 refills | Status: AC
Start: 2015-10-17 — End: 2015-10-17

## 2015-10-17 MED ORDER — MAGNESIUM CITRATE ORAL SOLN
ORAL | 0 refills | Status: AC
Start: 2015-10-17 — End: 2015-10-17

## 2015-10-17 MED ORDER — POLYETHYLENE GLYCOL 3350 17 GRAM (100 %) ORAL POWDER PACKET
17 gram | PACK | Freq: Every day | ORAL | 0 refills | Status: AC
Start: 2015-10-17 — End: 2015-11-16

## 2015-10-17 MED ORDER — DOCUSATE SODIUM 100 MG CAP
100 mg | ORAL_CAPSULE | Freq: Two times a day (BID) | ORAL | 0 refills | Status: AC
Start: 2015-10-17 — End: 2015-11-16

## 2015-10-17 MED ORDER — MINERAL OIL ORAL
99.5 % | Freq: Once | ORAL | Status: AC
Start: 2015-10-17 — End: 2015-10-17
  Administered 2015-10-17: 21:00:00 via RECTAL

## 2015-10-17 MED FILL — GLYCERIN 99.5 % TOPICAL SOLN: 99.5 % | CUTANEOUS | Qty: 125

## 2015-10-17 MED FILL — LIDOCAINE VISCOUS 2 % MUCOSAL SOLUTION: 2 % | Qty: 15

## 2015-10-17 NOTE — ED Notes (Signed)
Assume care of patient, patient with constipation and coccyx pain, patient just had surgery about 1 week ago.  Purposeful rounding completed:    Side rails up x 2:  YES  Bed in low position and wheels locked: YES  Call bell within reach: YES  Comfort addressed: YES    Toileting needs addressed: YES  Plan of care reviewed/updated with patient and or family members: YES  IV site assessed: N/A  Pain assessed and addressed: YES, 8

## 2015-10-17 NOTE — ED Notes (Signed)
Patient manually disimpacting self

## 2015-10-17 NOTE — ED Notes (Signed)
Patient given Mia Campbell enema, tolerated procedure fair

## 2015-10-17 NOTE — ED Provider Notes (Signed)
HPI Comments: 1:32 PM Mia Campbell is a 25 y.o. female with a history of Psychiatric disorder and Hypercholesteremia who presents to the emergency department c/o conmstipation onset last couple days. The patient explains she had surgery on her right foot last Thursday, and was prescribed narcotics for the pain. The pt stated she had no difficulties having bowel movements immediately after the surgery, but symptoms developed over time. She says she feels like something is "ripping" when she attempts to have a bowel movement. Pt also c/o difficulty urinating and fever. No other concerns at this time.       PCP: Maceo Pro, NP      The history is provided by the patient.        Past Medical History:   Diagnosis Date   ??? Hypercholesteremia    ??? Psychiatric disorder     depression, anxiety       Past Surgical History:   Procedure Laterality Date   ??? HX HEENT      Adrenal gland removed   ??? HX OOPHORECTOMY      rt.ovary   ??? HX ORTHOPAEDIC      metatarsel surgery right foot         History reviewed. No pertinent family history.    Social History     Social History   ??? Marital status: SINGLE     Spouse name: N/A   ??? Number of children: N/A   ??? Years of education: N/A     Occupational History   ??? Not on file.     Social History Main Topics   ??? Smoking status: Current Every Day Smoker     Packs/day: 0.50   ??? Smokeless tobacco: Never Used   ??? Alcohol use No      Comment: sometimes   ??? Drug use: No   ??? Sexual activity: Not on file     Other Topics Concern   ??? Not on file     Social History Narrative         ALLERGIES: Review of patient's allergies indicates no known allergies.    Review of Systems   Constitutional: Positive for fever. Negative for chills and fatigue.   HENT: Negative for congestion, rhinorrhea and sore throat.    Respiratory: Negative for cough and shortness of breath.    Cardiovascular: Negative for chest pain and palpitations.    Gastrointestinal: Positive for constipation. Negative for abdominal pain, diarrhea, nausea and vomiting.   Genitourinary: Positive for difficulty urinating. Negative for dysuria, hematuria and urgency.   Musculoskeletal: Negative for myalgias.   Skin: Negative for rash and wound.   Neurological: Negative for dizziness and headaches.   All other systems reviewed and are negative.      Vitals:    10/17/15 1314   BP: 125/86   Pulse: 87   Resp: 16   Temp: 98.4 ??F (36.9 ??C)   SpO2: 100%   Weight: 72.6 kg (160 lb)            Physical Exam   Constitutional:   General:  Well-developed, well-nourished, no apparent distress.    Head:  Normocephalic atraumatic.    Eyes:  Pupils midrange extraocular movements intact.  No pallor or conjunctival injection.    Nose:  No rhinorrhea, inspection grossly normal.    Ears:  Grossly normal to inspection, no discharge.    Mouth:  Mucous membranes moist, no appreciable intraoral lesion.    Neck:  Trachea midline, no asymmetry.  Chest:  Grossly normal inspection, symmetric chest rise.    Pulmonary:  Clear to auscultation bilaterally no wheezes rhonchi or rales.    Cardiovascular:  S1-S2 no murmurs rubs or gallops.    Abdomen: Soft, nontender, nondistended no guarding rebound or peritoneal signs.  No CVA tenderness  Rectal: Chaperoned by nurse Clydie Braun.  Good tone no hemorrhoid no blood appreciated, does have a large ball of hard stool high in the rectal vault  Extremities:  Grossly normal to inspection, peripheral pulses intact.    Neurologic:  Alert and oriented no appreciable focal neurologic deficit.    Psychiatric:  Grossly normal mood and affect.    Nursing note reviewed, vital signs reviewed.        MDM  Number of Diagnoses or Management Options  Diagnosis management comments: ED course:  Patient with constipation type symptoms, likely precipitated by her narcotic medications.  She is afebrile and not tachycardic saturation normal on room air.    X-ray consistent with constipation     Digital disimpaction was not productive large amount of stool, we'll proceed with enema and reevaluate    1815: Patient felt better after heart enema and then a soapsuds enema, she has able move her bowels.  She'll be discharged on strict instructions to use a bowel regimen while taking narcotic medications.    Patient's presentation, history, physical exam and laboratory evaluations were reviewed.  At this time patient was felt to be stable for outpatient management and follow with primary care/specialist.  Patient was instructed to return to the emergency department with any concerns.    Disposition:    Discharged home      Portions of this chart were created with Dragon medical speech to text program.   Unrecognized errors may be present.      ED Course       Procedures        Scribe Attestation  Leslie Andrea scribing for and in presence of Despina Hick, MD (10/17/15)      Physician Attestation  I personally preformed the services described in this documentation, reviewed and edited the documentation which was dictated to the scribe in my presence, and it accurately records my words and actions.     Despina Hick, MD (10/17/15)      Signed by: Leslie Andrea, Scribe, 10/17/15, 1:31 PM  \\

## 2015-10-17 NOTE — ED Notes (Signed)
I performed a brief evaluation, including history and physical, of the patient here in triage and I have determined that pt will need further treatment and evaluation from the main side ER physician.  I have placed initial orders to help in expediting patients care.     October 17, 2015 at 1:18 PM - Royal Hawthorn, DO        Visit Vitals   ??? BP 125/86 (BP 1 Location: Right arm, BP Patient Position: At rest)   ??? Pulse 87   ??? Temp 98.4 ??F (36.9 ??C)   ??? Resp 16   ??? Wt 72.6 kg (160 lb)   ??? SpO2 100%   ??? BMI 26.63 kg/m2

## 2015-10-17 NOTE — ED Triage Notes (Addendum)
Sent from Dr. Cheree Ditto office for opiod induced constipation. Patient says there is blood in stool

## 2015-11-28 IMAGING — CR DG CHEST 2V
2 series · 2 of 2 positions shown · non-contrast
Comparison: Prior radiograph from 11/20/2013

CLINICAL DATA: cough, sob

EXAM:
CHEST  2 VIEW

[view not recorded (1 of 2)]
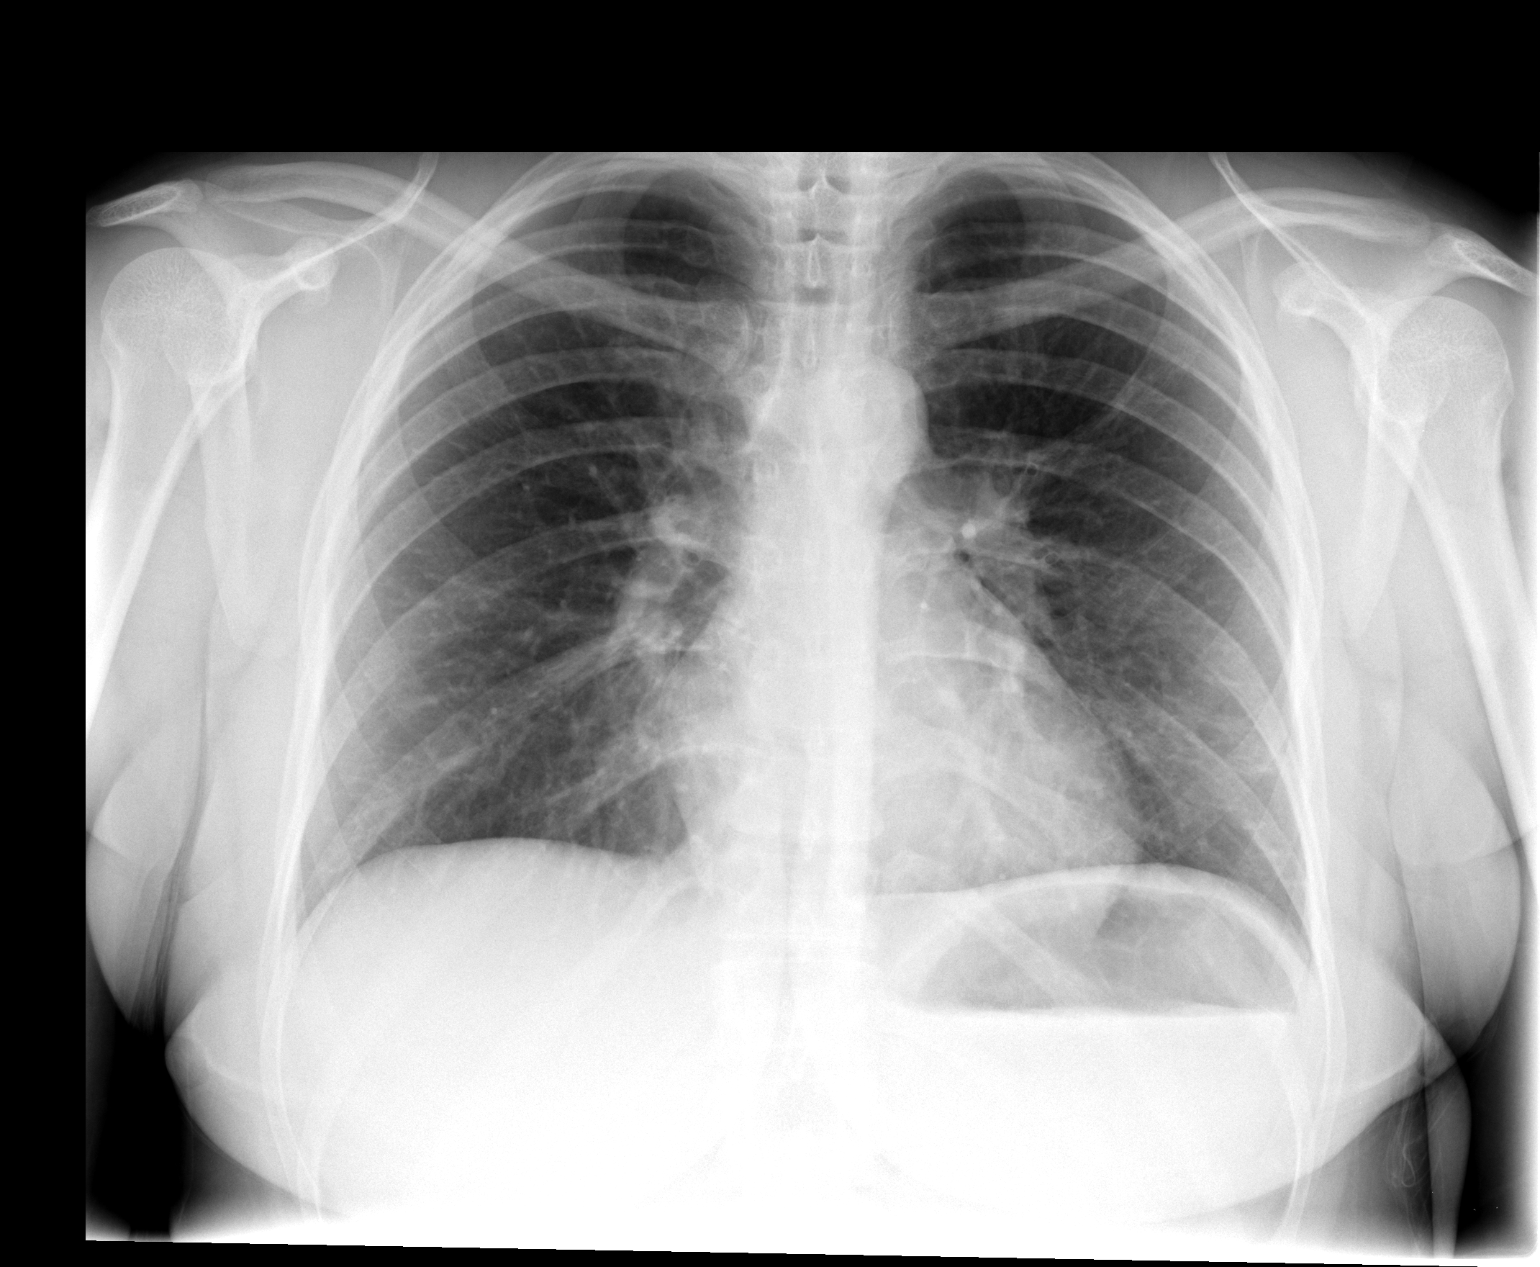

[view not recorded (2 of 2)]
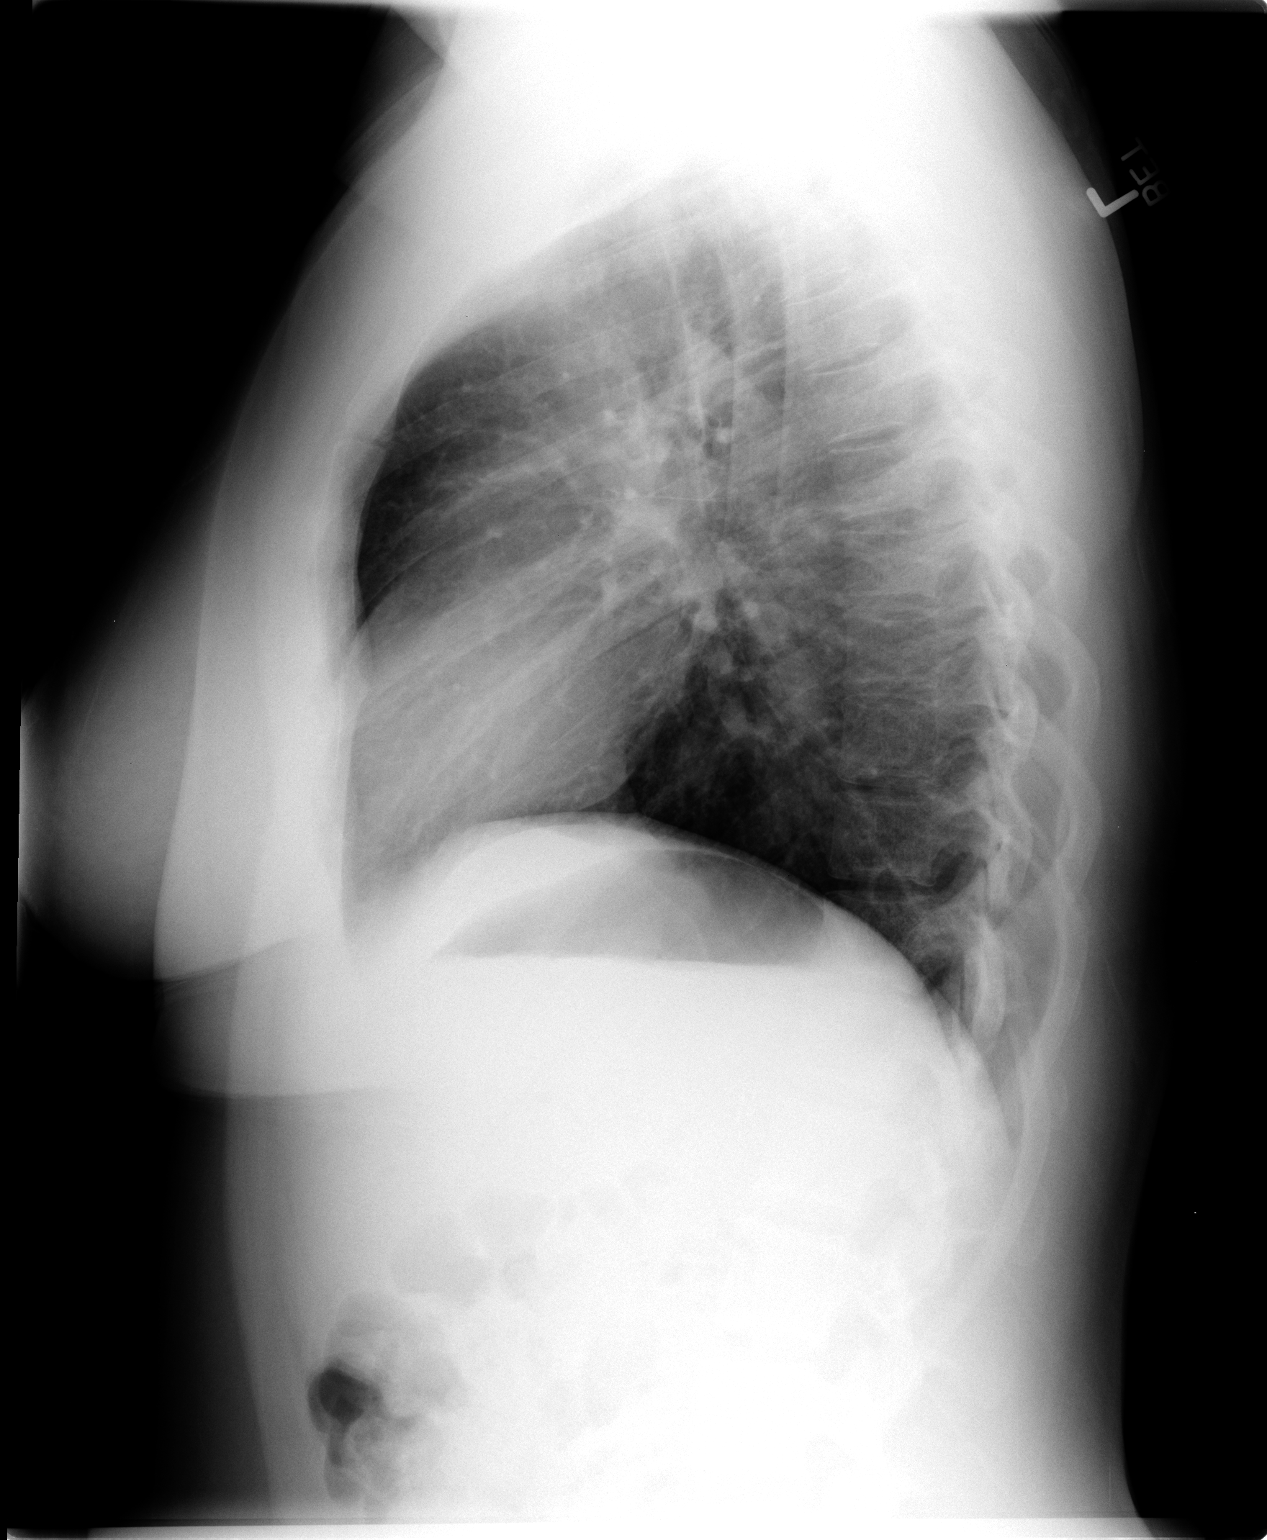

[2 of 2 positions shown; findings below may reference images not displayed]

FINDINGS: The cardiac and mediastinal silhouettes are stable in size and
contour, and remain within normal limits.

The lungs are normally inflated. No airspace consolidation, pleural
effusion, or pulmonary edema is identified. There is no
pneumothorax.

No acute osseous abnormality identified.
IMPRESSION: No active cardiopulmonary disease.

## 2016-02-06 ENCOUNTER — Ambulatory Visit (HOSPITAL_COMMUNITY): Admit: 2016-02-06 | Payer: Medicaid - Out of State

## 2016-02-06 ENCOUNTER — Emergency Department (HOSPITAL_COMMUNITY)
Admission: EM | Admit: 2016-02-06 | Discharge: 2016-02-06 | Disposition: A | Discharge status: Home / Self Care | Payer: Medicaid - Out of State | Attending: Emergency Medicine | Admitting: Emergency Medicine

## 2016-02-06 ENCOUNTER — Encounter (HOSPITAL_COMMUNITY): Payer: Self-pay | Admitting: *Deleted

## 2016-02-06 ENCOUNTER — Emergency Department (HOSPITAL_COMMUNITY): Payer: Medicaid - Out of State

## 2016-02-06 DIAGNOSIS — R600 Localized edema: Secondary | ICD-10-CM | POA: Diagnosis not present

## 2016-02-06 DIAGNOSIS — M7989 Other specified soft tissue disorders: Secondary | ICD-10-CM | POA: Diagnosis present

## 2016-02-06 DIAGNOSIS — F909 Attention-deficit hyperactivity disorder, unspecified type: Secondary | ICD-10-CM | POA: Diagnosis not present

## 2016-02-06 DIAGNOSIS — F329 Major depressive disorder, single episode, unspecified: Secondary | ICD-10-CM | POA: Insufficient documentation

## 2016-02-06 DIAGNOSIS — Z791 Long term (current) use of non-steroidal anti-inflammatories (NSAID): Secondary | ICD-10-CM | POA: Insufficient documentation

## 2016-02-06 HISTORY — DX: Depression, unspecified: F32.A

## 2016-02-06 HISTORY — DX: Major depressive disorder, single episode, unspecified: F32.9

## 2016-02-06 HISTORY — DX: Noninfective gastroenteritis and colitis, unspecified: K52.9

## 2016-02-06 HISTORY — DX: Attention-deficit hyperactivity disorder, unspecified type: F90.9

## 2016-02-06 MED ORDER — OXYCODONE HCL 5 MG PO TABS
5.0000 mg | ORAL_TABLET | Freq: Once | ORAL | Status: AC
  Administered 2016-02-06: 5 mg via ORAL
  Filled 2016-02-06: qty 1

## 2016-02-06 MED ORDER — OXYCODONE HCL 5 MG PO TABS: 5.0000 mg | ORAL_TABLET | Freq: Four times a day (QID) | ORAL | Status: AC | PRN

## 2016-02-06 NOTE — ED Provider Notes (Signed)
TIME SEEN: 2:20 AM  CHIEF COMPLAINT: Bilateral leg swelling, pain  HPI: Pt is a 25 y.o. female with history of chronic pain, depression who presents to the emergency department bilateral lower study swelling and pain. Reports symptoms have been present for the past several days and progressively worsening. Now having a hard time ambulate because of pain. Pain is worse in the left ankle than the right now she has had ecchymosis to the medial and left ankle. No known injury. No numbness, tingling or focal weakness. No history of PE or DVT. She denies any cough, chest pain or shortness of breath. No fever. Did see her PCP today and had blood work drawn. Was told she was hypokalemic and started on potassium tablets as well as Lasix. Was not prescribed anything for pain per her report. She does not work, is on disability.  ROS: See HPI Constitutional: no fever  Eyes: no drainage  ENT: no runny nose   Cardiovascular:  no chest pain  Resp: no SOB  GI: no vomiting GU: no dysuria Integumentary: no rash  Allergy: no hives  Musculoskeletal: no leg swelling  Neurological: no slurred speech ROS otherwise negative  PAST MEDICAL HISTORY/PAST SURGICAL HISTORY:  Past Medical History  Diagnosis Date  . Ovarian cyst   . Benign tumor of adrenal gland   . Chronic pelvic pain in female   . Chronic back pain   . Anxiety   . Bronchitis   . Depression   . ADHD (attention deficit hyperactivity disorder)   . Colitis     MEDICATIONS:  Prior to Admission medications   Medication Sig Start Date End Date Taking? Authorizing Provider  cyclobenzaprine (FLEXERIL) 5 MG tablet Take 1 tablet (5 mg total) by mouth 3 (three) times daily as needed. 02/26/15   Rolland Porter, MD  ibuprofen (ADVIL,MOTRIN) 800 MG tablet Take 800 mg by mouth every 8 (eight) hours as needed for mild pain.    Historical Provider, MD  naproxen (NAPROSYN) 500 MG tablet Take 1 po BID with food prn pain 02/26/15   Rolland Porter, MD  ondansetron (ZOFRAN  ODT) 4 MG disintegrating tablet Take 1 tablet (4 mg total) by mouth every 8 (eight) hours as needed for nausea or vomiting. Patient not taking: Reported on 12/01/2014 08/11/14   Kristen N Ward, DO  oxyCODONE (ROXICODONE) 5 MG immediate release tablet Take 1 tablet (5 mg total) by mouth every 6 (six) hours as needed for severe pain. 12/01/14   Sherwood Gambler, MD  PRESCRIPTION MEDICATION Apply 1 application topically as needed (Rash).    Historical Provider, MD    ALLERGIES:  Allergies  Allergen Reactions  . Acetaminophen Rash    Constipation with tylenol #3    SOCIAL HISTORY:  Social History  Substance Use Topics  . Smoking status: Current Every Day Smoker -- 0.50 packs/day for 10 years    Types: Cigarettes  . Smokeless tobacco: Never Used  . Alcohol Use: No    FAMILY HISTORY: Family History  Problem Relation Age of Onset  . Diabetes Maternal Grandmother   . Heart disease Maternal Grandmother   . Diabetes Maternal Grandfather   . Heart disease Maternal Grandfather     EXAM: BP 127/75 mmHg  Pulse 73  Temp(Src) 98.2 F (36.8 C) (Oral)  Resp 18  Ht 5\' 5"  (1.651 m)  Wt 195 lb (88.451 kg)  BMI 32.45 kg/m2  SpO2 98% CONSTITUTIONAL: Alert and oriented and responds appropriately to questions. Well-appearing; well-nourished HEAD: Normocephalic EYES: Conjunctivae clear,  PERRL ENT: normal nose; no rhinorrhea; moist mucous membranes NECK: Supple, no meningismus, no LAD  CARD: RRR; S1 and S2 appreciated; no murmurs, no clicks, no rubs, no gallops RESP: Normal chest excursion without splinting or tachypnea; breath sounds clear and equal bilaterally; no wheezes, no rhonchi, no rales, no hypoxia or respiratory distress, speaking full sentences ABD/GI: Normal bowel sounds; non-distended; soft, non-tender, no rebound, no guarding, no peritoneal signs BACK:  The back appears normal and is non-tender to palpation, there is no CVA tenderness EXT: Normal ROM in all joints; No bony deformity,  2+ DP pulses bilaterally, Nonpitting edema to the knees bilaterally, tender to palpation from the knees down to the feet bilaterally; normal capillary refill; no cyanosis, patient does have posterior calf tenderness bilaterally. Extremities are warm and well-perfused. Compartments are soft. There is ecchymosis to the medial and lateral aspect of the left ankle and decreased range of motion in his ankle secondary to pain. Difficult to assess ligamentous laxity given patient's poor tolerance to this exam. SKIN: Normal color for age and race; warm; no rash NEURO: Moves all extremities equally, sensation to light touch intact diffusely, cranial nerves II through XII intact PSYCH: The patient's mood and manner are appropriate. Grooming and personal hygiene are appropriate.  MEDICAL DECISION MAKING: Patient here with bilateral leg swelling. Swelling is nonpitting and there is no sign of cellulitis, septic arthritis, gout on exam. She denies any history of injury but has ecchymosis to the left ankle and foot. She has strong pulses in her extremities are warm and well-perfused. She reports normal sensation diffusely. We'll obtain x-rays of the left foot and ankle. No bony deformity on exam. States she had blood work drawn today which was normal other than hypokalemia. She has PCP follow-up next week. Will order her a venous Doppler of both of her legs for the morning. Will provide her with pain medication. She states her nephew is here to drive her home.  ED PROGRESS: X-ray show no acute abnormality. Have advised to return in the morning for a venous Doppler of both lower extremities. She does have PCP follow-up. Again no sign of infection, arterial obstruction, neurologic deficit. Have discussed return precautions. Patient verbalizes understanding and is comfortable with this plan.    At this time, I do not feel there is any life-threatening condition present. I have reviewed and discussed all results (EKG,  imaging, lab, urine as appropriate), exam findings with patient. I have reviewed nursing notes and appropriate previous records.  I feel the patient is safe to be discharged home without further emergent workup. Discussed usual and customary return precautions. Patient and family (if present) verbalize understanding and are comfortable with this plan.  Patient will follow-up with their primary care provider. If they do not have a primary care provider, information for follow-up has been provided to them. All questions have been answered.      [place], DO 02/06/16 [id number]

## 2016-02-06 NOTE — ED Notes (Signed)
Pt c/o bilateral leg and ankle pain and swelling.

## 2016-02-06 NOTE — Discharge Instructions (Signed)
Edema °Edema is an abnormal buildup of fluids in your body tissues. Edema is somewhat dependent on gravity to pull the fluid to the lowest place in your body. That makes the condition more common in the legs and thighs (lower extremities). Painless swelling of the feet and ankles is common and becomes more likely as you get older. It is also common in looser tissues, like around your eyes.  °When the affected area is squeezed, the fluid may move out of that spot and leave a dent for a few moments. This dent is called pitting.  °CAUSES  °There are many possible causes of edema. Eating too much salt and being on your feet or sitting for a long time can cause edema in your legs and ankles. Hot weather may make edema worse. Common medical causes of edema include: °· Heart failure. °· Liver disease. °· Kidney disease. °· Weak blood vessels in your legs. °· Cancer. °· An injury. °· Pregnancy. °· Some medications. °· Obesity.  °SYMPTOMS  °Edema is usually painless. Your skin may look swollen or shiny.  °DIAGNOSIS  °Your health care provider may be able to diagnose edema by asking about your medical history and doing a physical exam. You may need to have tests such as X-rays, an electrocardiogram, or blood tests to check for medical conditions that may cause edema.  °TREATMENT  °Edema treatment depends on the cause. If you have heart, liver, or kidney disease, you need the treatment appropriate for these conditions. General treatment may include: °· Elevation of the affected body part above the level of your heart. °· Compression of the affected body part. Pressure from elastic bandages or support stockings squeezes the tissues and forces fluid back into the blood vessels. This keeps fluid from entering the tissues. °· Restriction of fluid and salt intake. °· Use of a water pill (diuretic). These medications are appropriate only for some types of edema. They pull fluid out of your body and make you urinate more often. This  gets rid of fluid and reduces swelling, but diuretics can have side effects. Only use diuretics as directed by your health care provider. °HOME CARE INSTRUCTIONS  °· Keep the affected body part above the level of your heart when you are lying down.   °· Do not sit still or stand for prolonged periods.   °· Do not put anything directly under your knees when lying down. °· Do not wear constricting clothing or garters on your upper legs.   °· Exercise your legs to work the fluid back into your blood vessels. This may help the swelling go down.   °· Wear elastic bandages or support stockings to reduce ankle swelling as directed by your health care provider.   °· Eat a low-salt diet to reduce fluid if your health care provider recommends it.   °· Only take medicines as directed by your health care provider.  °SEEK MEDICAL CARE IF:  °· Your edema is not responding to treatment. °· You have heart, liver, or kidney disease and notice symptoms of edema. °· You have edema in your legs that does not improve after elevating them.   °· You have sudden and unexplained weight gain. °SEEK IMMEDIATE MEDICAL CARE IF:  °· You develop shortness of breath or chest pain.   °· You cannot breathe when you lie down. °· You develop pain, redness, or warmth in the swollen areas.   °· You have heart, liver, or kidney disease and suddenly get edema. °· You have a fever and your symptoms suddenly get worse. °MAKE SURE YOU:  °·   Understand these instructions.  Will watch your condition.  Will get help right away if you are not doing well or get worse.   This information is not intended to replace advice given to you by your health care provider. Make sure you discuss any questions you have with your health care provider.   Document Released: 08/09/2005 Document Revised: 08/30/2014 Document Reviewed: 06/01/2013 Elsevier Interactive Patient Education 2016 [place] for Routine Care of Injuries Theroutine  careofmanyinjuriesincludes rest, ice, compression, and elevation (RICE therapy). RICE therapy is often recommended for injuries to soft tissues, such as a muscle strain, ligament injuries, bruises, and overuse injuries. It can also be used for some bony injuries. Using RICE therapy can help to relieve pain, lessen swelling, and enable your body to heal. Rest Rest is required to allow your body to heal. This usually involves reducing your normal activities and avoiding use of the injured part of your body. Generally, you can return to your normal activities when you are comfortable and have been given permission by your health care provider. Ice Icing your injury helps to keep the swelling down, and it lessens pain. Do not apply ice directly to your skin.  Put ice in a plastic bag.  Place a towel between your skin and the bag.  Leave the ice on for 20 minutes, 2-3 times a day. Do this for as long as you are directed by your health care provider. Compression Compression means putting pressure on the injured area. Compression helps to keep swelling down, gives support, and helps with discomfort. Compression may be done with an elastic bandage. If an elastic bandage has been applied, follow these general tips:  Remove and reapply the bandage every 3-4 hours or as directed by your health care provider.  Make sure the bandage is not wrapped too tightly, because this can cut off circulation. If part of your body beyond the bandage becomes blue, numb, cold, swollen, or more painful, your bandage is most likely too tight. If this occurs, remove your bandage and reapply it more loosely.  See your health care provider if the bandage seems to be making your problems worse rather than better. Elevation Elevation means keeping the injured area raised. This helps to lessen swelling and decrease pain. If possible, your injured area should be elevated at or above the level of your heart or the center of your  chest. [place]? You should seek medical care if:  Your pain and swelling continue.  Your symptoms are getting worse rather than improving. These symptoms may indicate that further evaluation or further X-rays are needed. Sometimes, X-rays may not show a small broken bone (fracture) until a number of days later. Make a follow-up appointment with your health care provider. WHEN SHOULD I SEEK IMMEDIATE MEDICAL CARE? You should seek immediate medical care if:  You have sudden severe pain at or below the area of your injury.  You have redness or increased swelling around your injury.  You have tingling or numbness at or below the area of your injury that does not improve after you remove the elastic bandage.   This information is not intended to replace advice given to you by your health care provider. Make sure you discuss any questions you have with your health care provider.   Document Released: 11/21/2000 Document Revised: 04/30/2015 Document Reviewed: 07/17/2014 Elsevier Interactive Patient Education [employer].
